# Patient Record
Sex: Female | Born: 1995 | Hispanic: Yes | Marital: Married | Smoking: Never smoker
Health system: Southern US, Community
[De-identification: ages and names within clinical notes are randomized; demographics above are authoritative.]

## PROBLEM LIST (undated history)

## (undated) ENCOUNTER — Inpatient Hospital Stay (HOSPITAL_COMMUNITY): Payer: Self-pay

## (undated) DIAGNOSIS — Z8759 Personal history of other complications of pregnancy, childbirth and the puerperium: Secondary | ICD-10-CM

## (undated) DIAGNOSIS — A6000 Herpesviral infection of urogenital system, unspecified: Secondary | ICD-10-CM

## (undated) DIAGNOSIS — D649 Anemia, unspecified: Secondary | ICD-10-CM

## (undated) DIAGNOSIS — A630 Anogenital (venereal) warts: Secondary | ICD-10-CM

## (undated) HISTORY — DX: Herpesviral infection of urogenital system, unspecified: A60.00

## (undated) HISTORY — PX: NO PAST SURGERIES: SHX2092

## (undated) HISTORY — DX: Personal history of other complications of pregnancy, childbirth and the puerperium: Z87.59

## (undated) HISTORY — DX: Anogenital (venereal) warts: A63.0

## (undated) HISTORY — DX: Anemia, unspecified: D64.9

---

## 2017-02-23 ENCOUNTER — Ambulatory Visit: Payer: Self-pay | Admitting: Obstetrics

## 2017-03-25 ENCOUNTER — Ambulatory Visit (INDEPENDENT_AMBULATORY_CARE_PROVIDER_SITE_OTHER): Payer: BLUE CROSS/BLUE SHIELD

## 2017-03-25 DIAGNOSIS — Z3201 Encounter for pregnancy test, result positive: Secondary | ICD-10-CM | POA: Diagnosis not present

## 2017-03-25 DIAGNOSIS — N912 Amenorrhea, unspecified: Secondary | ICD-10-CM

## 2017-03-25 LAB — POCT URINE PREGNANCY: Preg Test, Ur: POSITIVE — AB

## 2017-03-25 NOTE — Progress Notes (Signed)
Pt presents for Nurse Visit today to confirm pregnancy. LMP:02/09/17 then again 02/19/17 was only 2 days  EDD:11/16/16 Pt given prenatal samples.

## 2017-03-25 NOTE — Progress Notes (Signed)
I have reviewed this chart and agree with the RN assessment and management.    K. Meryl Davis, M.D. Attending Obstetrician & Gynecologist, [facility],  Medical Group  

## 2017-04-02 ENCOUNTER — Other Ambulatory Visit: Payer: Self-pay

## 2017-04-02 DIAGNOSIS — Z3201 Encounter for pregnancy test, result positive: Secondary | ICD-10-CM

## 2017-04-02 MED ORDER — VITAFOL GUMMIES 3.33-0.333-34.8 MG PO CHEW: 1.0000 | CHEWABLE_TABLET | Freq: Every day | ORAL | 3 refills | Status: DC

## 2017-04-07 ENCOUNTER — Inpatient Hospital Stay (HOSPITAL_COMMUNITY)
Admission: AD | Admit: 2017-04-07 | Discharge: 2017-04-07 | Disposition: A | Discharge status: Home / Self Care | Payer: BLUE CROSS/BLUE SHIELD | Source: Ambulatory Visit | Attending: Family Medicine | Admitting: Family Medicine

## 2017-04-07 ENCOUNTER — Encounter (HOSPITAL_COMMUNITY): Payer: Self-pay

## 2017-04-07 ENCOUNTER — Inpatient Hospital Stay (HOSPITAL_COMMUNITY): Payer: BLUE CROSS/BLUE SHIELD

## 2017-04-07 DIAGNOSIS — Z3A01 Less than 8 weeks gestation of pregnancy: Secondary | ICD-10-CM | POA: Insufficient documentation

## 2017-04-07 DIAGNOSIS — Z3490 Encounter for supervision of normal pregnancy, unspecified, unspecified trimester: Secondary | ICD-10-CM

## 2017-04-07 DIAGNOSIS — N939 Abnormal uterine and vaginal bleeding, unspecified: Secondary | ICD-10-CM | POA: Diagnosis present

## 2017-04-07 DIAGNOSIS — O210 Mild hyperemesis gravidarum: Secondary | ICD-10-CM | POA: Diagnosis not present

## 2017-04-07 DIAGNOSIS — O209 Hemorrhage in early pregnancy, unspecified: Secondary | ICD-10-CM | POA: Insufficient documentation

## 2017-04-07 DIAGNOSIS — O219 Vomiting of pregnancy, unspecified: Secondary | ICD-10-CM | POA: Diagnosis not present

## 2017-04-07 LAB — URINALYSIS, ROUTINE W REFLEX MICROSCOPIC
Bilirubin Urine: NEGATIVE
GLUCOSE, UA: NEGATIVE mg/dL
KETONES UR: 5 mg/dL — AB
Leukocytes, UA: NEGATIVE
NITRITE: NEGATIVE
PH: 7 (ref 5.0–8.0)
PROTEIN: NEGATIVE mg/dL
Specific Gravity, Urine: 1.017 (ref 1.005–1.030)

## 2017-04-07 LAB — ABO/RH: ABO/RH(D): O POS

## 2017-04-07 LAB — CBC
HEMATOCRIT: 35.7 % — AB (ref 36.0–46.0)
Hemoglobin: 11.6 g/dL — ABNORMAL LOW (ref 12.0–15.0)
MCH: 29.8 pg (ref 26.0–34.0)
MCHC: 32.5 g/dL (ref 30.0–36.0)
MCV: 91.8 fL (ref 78.0–100.0)
PLATELETS: 281 10*3/uL (ref 150–400)
RBC: 3.89 MIL/uL (ref 3.87–5.11)
RDW: 13.7 % (ref 11.5–15.5)
WBC: 7.8 10*3/uL (ref 4.0–10.5)

## 2017-04-07 LAB — WET PREP, GENITAL
Sperm: NONE SEEN
Trich, Wet Prep: NONE SEEN
YEAST WET PREP: NONE SEEN

## 2017-04-07 LAB — HCG, QUANTITATIVE, PREGNANCY: HCG, BETA CHAIN, QUANT, S: 46447 m[IU]/mL — AB (ref <5)

## 2017-04-07 MED ORDER — PROMETHAZINE HCL 25 MG PO TABS: 12.5000 mg | ORAL_TABLET | Freq: Four times a day (QID) | ORAL | 0 refills | Status: DC | PRN

## 2017-04-07 NOTE — MAU Note (Signed)
Pt had some light pinkish vaginal bleeding this morning. No clots seen. Also noticed some watery discharge. No pain.

## 2017-04-07 NOTE — MAU Provider Note (Signed)
History     CSN: [id number]  Arrival date and time: 04/07/17 [id number]   First Provider Initiated Contact with Patient 04/07/17 [phone]      Chief Complaint  Patient presents with  . Vaginal Bleeding   Vaginal Bleeding  The patient's primary symptoms include vaginal bleeding. The patient's pertinent negatives include no pelvic pain. This is a new problem. The current episode started today (around 0630 ). The problem occurs constantly. The problem has been unchanged. The patient is experiencing no pain. She is pregnant. Associated symptoms include nausea. Pertinent negatives include no chills, dysuria, fever, frequency, urgency or vomiting. The vaginal bleeding is lighter than menses (pink). She has not been passing clots. She has not been passing tissue. The symptoms are aggravated by intercourse. She has tried nothing for the symptoms. Sexual activity: patient has had intercourse in the last 24 hours.  Her menstrual history has been regular (LMP 02/09/17 ).    Past Medical History:  Diagnosis Date  . HSV infection     Past Surgical History:  Procedure Laterality Date  . NO PAST SURGERIES      History reviewed. No pertinent family history.  Social History   Tobacco Use  . Smoking status: Never Smoker  . Smokeless tobacco: Never Used  Substance Use Topics  . Alcohol use: No    Frequency: Never  . Drug use: No    Allergies: No Known Allergies  Medications Prior to Admission  Medication Sig Dispense Refill Last Dose  . Prenatal Vit-Fe Phos-FA-Omega (VITAFOL GUMMIES) 3.33-0.333-34.8 MG CHEW Chew 1 tablet by mouth daily. (Patient taking differently: Chew 3 tablets by mouth daily. ) 90 tablet 3 Past Week at Unknown time    Review of Systems  Constitutional: Negative for chills and fever.  Gastrointestinal: Positive for nausea. Negative for vomiting.  Genitourinary: Positive for vaginal bleeding. Negative for dysuria, frequency, pelvic pain and urgency.   Physical Exam    Blood pressure 114/61, pulse 70, temperature 97.9 F (36.6 C), resp. rate 16, height 5\' 2"  (1.575 m), weight 109 lb (49.4 kg), last menstrual period 02/09/2017.  Physical Exam  Nursing note and vitals reviewed. Constitutional: She is oriented to person, place, and time. She appears well-developed and well-nourished.  HENT:  Head: Normocephalic.  Cardiovascular: Normal rate.  Respiratory: Effort normal.  GI: Soft. There is no tenderness. There is no rebound.  Neurological: She is alert and oriented to person, place, and time.  Skin: Skin is warm and dry.  Psychiatric: She has a normal mood and affect.     Results for orders placed or performed during the hospital encounter of 04/07/17 (from the past 24 hour(s))  Urinalysis, Routine w reflex microscopic     Status: Abnormal   Collection Time: 04/07/17  9:00 AM  Result Value Ref Range   Color, Urine YELLOW YELLOW   APPearance CLOUDY (A) CLEAR   Specific Gravity, Urine 1.017 1.005 - 1.030   pH 7.0 5.0 - 8.0   Glucose, UA NEGATIVE NEGATIVE mg/dL   Hgb urine dipstick SMALL (A) NEGATIVE   Bilirubin Urine NEGATIVE NEGATIVE   Ketones, ur 5 (A) NEGATIVE mg/dL   Protein, ur NEGATIVE NEGATIVE mg/dL   Nitrite NEGATIVE NEGATIVE   Leukocytes, UA NEGATIVE NEGATIVE   RBC / HPF 0-5 0 - 5 RBC/hpf   WBC, UA 0-5 0 - 5 WBC/hpf   Bacteria, UA RARE (A) NONE SEEN   Squamous Epithelial / LPF 0-5 (A) NONE SEEN   Mucus PRESENT   hCG, quantitative,  pregnancy     Status: Abnormal   Collection Time: 04/07/17  9:53 AM  Result Value Ref Range   hCG, Beta Chain, Quant, S 46,447 (H) <5 mIU/mL  CBC     Status: Abnormal   Collection Time: 04/07/17  9:54 AM  Result Value Ref Range   WBC 7.8 4.0 - 10.5 K/uL   RBC 3.89 3.87 - 5.11 MIL/uL   Hemoglobin 11.6 (L) 12.0 - 15.0 g/dL   HCT 29.535.7 (L) 62.136.0 - 30.846.0 %   MCV 91.8 78.0 - 100.0 fL   MCH 29.8 26.0 - 34.0 pg   MCHC 32.5 30.0 - 36.0 g/dL   RDW 65.713.7 84.611.5 - 96.215.5 %   Platelets 281 150 - 400 K/uL  Wet prep,  genital     Status: Abnormal   Collection Time: 04/07/17 10:01 AM  Result Value Ref Range   Yeast Wet Prep HPF POC NONE SEEN NONE SEEN   Trich, Wet Prep NONE SEEN NONE SEEN   Clue Cells Wet Prep HPF POC PRESENT (A) NONE SEEN   WBC, Wet Prep HPF POC FEW (A) NONE SEEN   Sperm NONE SEEN    [place] Ob Comp Less 14 Wks  Result Date: 04/07/2017 CLINICAL DATA:  Bleeding. EXAM: OBSTETRIC <14 WK US AND TRANSVAGINAL OB US TECHNIQUE: Both transabdominal and transvaginal ultrasound examinations were performed for complete evaluation of the gestation as well as the maternal uterus, adnexal regions, and pelvic cul-de-sac. Transvaginal technique was performed to assess early pregnancy. COMPARISON:  None. FINDINGS: Intrauterine gestational sac: Single Yolk sac:  Visualized. Embryo:  Visualized. Cardiac Activity: Present Heart Rate: 112  bpm MSD:   mm    w     d CRL:  5.2  mm   6 w   1 d                  US EDC: 11/30/2017 Subchorionic hemorrhage:  None visualized. Maternal uterus/adnexae: Right ovary: Normal Left ovary: Normal Other :None Free fluid:  Trace IMPRESSION: 1. Single living intrauterine gestation. The estimated gestational age is 6 weeks and 1 day. No complications. Electronically Signed   By: Signa Kellaylor  Stroud M.D.   On: 04/07/2017 11:01   [place] Ob Transvaginal  Result Date: 04/07/2017 CLINICAL DATA:  Bleeding. EXAM: OBSTETRIC <14 WK US AND TRANSVAGINAL OB US TECHNIQUE: Both transabdominal and transvaginal ultrasound examinations were performed for complete evaluation of the gestation as well as the maternal uterus, adnexal regions, and pelvic cul-de-sac. Transvaginal technique was performed to assess early pregnancy. COMPARISON:  None. FINDINGS: Intrauterine gestational sac: Single Yolk sac:  Visualized. Embryo:  Visualized. Cardiac Activity: Present Heart Rate: 112  bpm MSD:   mm    w     d CRL:  5.2  mm   6 w   1 d                  US EDC: 11/30/2017 Subchorionic hemorrhage:  None visualized. Maternal  uterus/adnexae: Right ovary: Normal Left ovary: Normal Other :None Free fluid:  Trace IMPRESSION: 1. Single living intrauterine gestation. The estimated gestational age is 6 weeks and 1 day. No complications. Electronically Signed   By: Signa Kellaylor  Stroud M.D.   On: 04/07/2017 11:01    MAU Course  Procedures  MDM   Assessment and Plan   1. Intrauterine pregnancy   2. [redacted] weeks gestation of pregnancy   3. Nausea and vomiting in pregnancy    DC home Comfort measures reviewed  1st Trimester  precautions  Bleeding precautions RX: phenergan PRN #30  Return to MAU as needed FU with OB as planned  Follow-up Information    Department, [facility] Follow up.   Contact information: [address] [place] [address] [phone]            Thressa Sheller 04/07/2017, 11:09 AM

## 2017-04-07 NOTE — Discharge Instructions (Signed)
Safe Medications in Pregnancy  ° °Acne: °Benzoyl Peroxide °Salicylic Acid ° °Backache/Headache: °Tylenol: 2 regular strength every 4 hours OR °             2 Extra strength every 6 hours ° °Colds/Coughs/Allergies: °Benadryl (alcohol free) 25 mg every 6 hours as needed °Breath right strips °Claritin °Cepacol throat lozenges °Chloraseptic throat spray °Cold-Eeze- up to three times per day °Cough drops, alcohol free °Flonase (by prescription only) °Guaifenesin °Mucinex °Robitussin DM (plain only, alcohol free) °Saline nasal spray/drops °Sudafed (pseudoephedrine) & Actifed ** use only after [redacted] weeks gestation and if you do not have high blood pressure °Tylenol °Vicks Vaporub °Zinc lozenges °Zyrtec  ° °Constipation: °Colace °Ducolax suppositories °Fleet enema °Glycerin suppositories °Metamucil °Milk of magnesia °Miralax °Senokot °Smooth move tea ° °Diarrhea: °Kaopectate °Imodium A-D ° °*NO pepto Bismol ° °Hemorrhoids: °Anusol °Anusol HC °Preparation H °Tucks ° °Indigestion: °Tums °Maalox °Mylanta °Zantac  °Pepcid ° °Insomnia: °Benadryl (alcohol free) 25mg every 6 hours as needed °Tylenol PM °Unisom, no Gelcaps ° °Leg Cramps: °Tums °MagGel ° °Nausea/Vomiting:  °Bonine °Dramamine °Emetrol °Ginger extract °Sea bands °Meclizine  °Nausea medication to take during pregnancy:  °Unisom (doxylamine succinate 25 mg tablets) Take one tablet daily at bedtime. If symptoms are not adequately controlled, the dose can be increased to a maximum recommended dose of two tablets daily (1/2 tablet in the morning, 1/2 tablet mid-afternoon and one at bedtime). °Vitamin B6 100mg tablets. Take one tablet twice a day (up to 200 mg per day). ° °Skin Rashes: °Aveeno products °Benadryl cream or 25mg every 6 hours as needed °Calamine Lotion °1% cortisone cream ° °Yeast infection: °Gyne-lotrimin 7 °Monistat 7 ° ° °**If taking multiple medications, please check labels to avoid duplicating the same active ingredients °**take medication as directed on  the label °** Do not exceed 4000 mg of tylenol in 24 hours °**Do not take medications that contain aspirin or ibuprofen ° °Prenatal Care Providers °[facility]    [facility] ° Phone- [phone]     Phone: [phone] °         °[facility]                      [facility] ° @[place]     Phone: [phone] ° Phone: [phone] °        [facility] °[facility]     Phone: [phone] ° Phone: [phone]   °        [facility] °[facility] [place]                hone: [phone] ° Phone: [phone] °        [facility] °Dr. Bernard Marshall      Phone: [phone] ° Phone: [phone] °        [facility] °[facility]                Phone: [phone] ° Women’s [facility]  ° Phone:[phone]    [facility] °         Phone: [phone] °[facility] °  Phone: [phone] ° °

## 2017-04-08 LAB — GC/CHLAMYDIA PROBE AMP (~~LOC~~) NOT AT ARMC
Chlamydia: NEGATIVE
NEISSERIA GONORRHEA: NEGATIVE

## 2017-04-28 NOTE — L&D Delivery Note (Signed)
Faculty Note  In to room, patient with chorioamnionitis, recurrent variable decels, amnioinfusion in place with minimal improvement. Epidural in place. Patient progressed and anterior lip able to be reduced, she was 10/100/+2 with pushing, she was pushing effective. Strip with variable decels and decreased variability, counseled her regarding recommendation for vacuum assisted delivery.  Reviewed risks of vacuum assisted delivery including risk of cephalohematoma, subgaleal hemorrhage, failed vacuum or dystocia requiring emergency c-section, risks of prolonged hypoxia in fetus, risks of damage to maternal tissue. Reviewed recommendation to proceed with assisted delivery and reviewed risks of c-section including infection, hemorrhage, damage to surrounding tissue and organs. She verbalizes understanding of the above and is agreeable to a vacuum assisted delivery.  Kiwi suction cup applied to fetal head, 2 cm anterior to the posterior fontanelle and equidistant across the sagittal sutures. The cup was inspected to ensure to vaginal tissue free from cup. Suction increased to 450 mm Hg and gentle traction placed on vacuum device during contraction to assist maternal expulsive forces. Total applied pressure time 40 seconds with 4 pulls. Zero popoffs occurred. Suction released with delivery of fetal head. Loose nuchal cord reduced, thick meconium noted. Infant delivered easily from ROA position. Infant with poor tone, cord immediately clamped and cut and infant handed to waiting pedi staff who inspected infant and found no evidence of cephalohematoma or similar injury. Cord blood and gases obtained.   Placenta delivered with gentle traction, and tore slightly. On inspection, all cotelydons intact however on sweep of uterus, small amount of membranes recovered manually. Uterus with poor tone, methergine given and uterus firmed with removal of membranes and administration of methergine. Patient had bilateral vaginal  wall and labial lacerations which were repaired with figure of 8 sutures and interrupted sutures of 3-0 monocryl.  Inspection of placenta after showed dark area with evidence of infarct. EBL: 900 mL. Apgars 3/9/9. Patient tolerated very well. Infant remains in room with patient and husband.   Baldemar LenisK. Meryl Jillyn Stacey, M.D. [facility]

## 2017-05-04 ENCOUNTER — Ambulatory Visit (INDEPENDENT_AMBULATORY_CARE_PROVIDER_SITE_OTHER): Payer: BLUE CROSS/BLUE SHIELD | Admitting: Obstetrics and Gynecology

## 2017-05-04 ENCOUNTER — Encounter: Payer: Self-pay | Admitting: *Deleted

## 2017-05-04 ENCOUNTER — Encounter: Payer: Self-pay | Admitting: Obstetrics and Gynecology

## 2017-05-04 DIAGNOSIS — Z3400 Encounter for supervision of normal first pregnancy, unspecified trimester: Secondary | ICD-10-CM | POA: Insufficient documentation

## 2017-05-04 DIAGNOSIS — Z8619 Personal history of other infectious and parasitic diseases: Secondary | ICD-10-CM

## 2017-05-04 DIAGNOSIS — Z113 Encounter for screening for infections with a predominantly sexual mode of transmission: Secondary | ICD-10-CM

## 2017-05-04 DIAGNOSIS — Z3401 Encounter for supervision of normal first pregnancy, first trimester: Secondary | ICD-10-CM

## 2017-05-04 NOTE — Progress Notes (Signed)
  Subjective:    Rachel Olsen is a G3P0020 5331w0d being seen today for her first obstetrical visit.  Her obstetrical history is significant for first pregnancy and history of genital herpes. Patient does intend to breast feed. Pregnancy history fully reviewed.  Patient reports nausea relieved with phenergan.  Vitals:   05/04/17 1019  BP: 104/71  Pulse: 88  Weight: 104 lb 11.2 oz (47.5 kg)    HISTORY: OB History  Gravida Para Term Preterm AB Living  3       2    SAB TAB Ectopic Multiple Live Births  1 1          # Outcome Date GA Lbr Len/2nd Weight Sex Delivery Anes PTL Lv  3 Current           2 TAB           1 SAB              Past Medical History:  Diagnosis Date  . Genital herpes   . HSV infection    Past Surgical History:  Procedure Laterality Date  . NO PAST SURGERIES     Family History  Problem Relation Age of Onset  . Diabetes Sister   . Diabetes Maternal Aunt   . Hypertension Maternal Aunt   . Diabetes Maternal Uncle   . Hypertension Maternal Uncle   . Diabetes Maternal Grandmother   . Hypertension Maternal Grandmother   . Diabetes Maternal Grandfather   . Hypertension Maternal Grandfather      Exam  Blood pressure 104/71, pulse 88, weight 104 lb 11.2 oz (47.5 kg), last menstrual period 02/09/2017.   Uterus:     Pelvic Exam:    Perineum: Normal Perineum   Vulva: normal   Vagina:  normal mucosa, normal discharge   pH:    Cervix: multiparous appearance and cervix is closed and long   Adnexa: normal adnexa and no mass, fullness, tenderness   Bony Pelvis: gynecoid  System: Breast:  normal appearance, no masses or tenderness   Skin: normal coloration and turgor, no rashes    Neurologic: oriented, no focal deficits   Extremities: normal strength, tone, and muscle mass   HEENT extra ocular movement intact   Mouth/Teeth mucous membranes moist, pharynx normal without lesions and dental hygiene good   Neck supple and no masses   Cardiovascular:  regular rate and rhythm   Respiratory:  chest clear, no wheezing, crepitations, rhonchi, normal symmetric air entry   Abdomen: soft, non-tender; bowel sounds normal; no masses,  no organomegaly   Urinary:       Assessment:    Pregnancy: [id number] Patient Active Problem List   Diagnosis Date Noted  . Supervision of normal first pregnancy, antepartum 05/04/2017        Plan:     Initial labs drawn. Prenatal vitamins. Problem list reviewed and updated. Genetic Screening discussed First Screen: declined. Will offer quad screen in second trimester  Ultrasound discussed; fetal survey: requested.  Follow up in 4 weeks. 50% of 30 min visit spent on counseling and coordination of care.     Xareni Kelch 05/04/2017

## 2017-05-04 NOTE — Patient Instructions (Addendum)
 First Trimester of Pregnancy The first trimester of pregnancy is from week 1 until the end of week 13 (months 1 through 3). A week after a sperm fertilizes an egg, the egg will implant on the wall of the uterus. This embryo will begin to develop into a baby. Genes from you and your partner will form the baby. The female genes will determine whether the baby will be a boy or a girl. At 6-8 weeks, the eyes and face will be formed, and the heartbeat can be seen on ultrasound. At the end of 12 weeks, all the baby's organs will be formed. Now that you are pregnant, you will want to do everything you can to have a healthy baby. Two of the most important things are to get good prenatal care and to follow your health care provider's instructions. Prenatal care is all the medical care you receive before the baby's birth. This care will help prevent, find, and treat any problems during the pregnancy and childbirth. Body changes during your first trimester Your body goes through many changes during pregnancy. The changes vary from woman to woman.  You may gain or lose a couple of pounds at first.  You may feel sick to your stomach (nauseous) and you may throw up (vomit). If the vomiting is uncontrollable, call your health care provider.  You may tire easily.  You may develop headaches that can be relieved by medicines. All medicines should be approved by your health care provider.  You may urinate more often. Painful urination may mean you have a bladder infection.  You may develop heartburn as a result of your pregnancy.  You may develop constipation because certain hormones are causing the muscles that push stool through your intestines to slow down.  You may develop hemorrhoids or swollen veins (varicose veins).  Your breasts may begin to grow larger and become tender. Your nipples may stick out more, and the tissue that surrounds them (areola) may become darker.  Your gums may bleed and may be  sensitive to brushing and flossing.  Dark spots or blotches (chloasma, mask of pregnancy) may develop on your face. This will likely fade after the baby is born.  Your menstrual periods will stop.  You may have a loss of appetite.  You may develop cravings for certain kinds of food.  You may have changes in your emotions from day to day, such as being excited to be pregnant or being concerned that something may go wrong with the pregnancy and baby.  You may have more vivid and strange dreams.  You may have changes in your hair. These can include thickening of your hair, rapid growth, and changes in texture. Some women also have hair loss during or after pregnancy, or hair that feels dry or thin. Your hair will most likely return to normal after your baby is born.  What to expect at prenatal visits During a routine prenatal visit:  You will be weighed to make sure you and the baby are growing normally.  Your blood pressure will be taken.  Your abdomen will be measured to track your baby's growth.  The fetal heartbeat will be listened to between weeks 10 and 14 of your pregnancy.  Test results from any previous visits will be discussed.  Your health care provider may ask you:  How you are feeling.  If you are feeling the baby move.  If you have had any abnormal symptoms, such as leaking fluid, bleeding, severe   headaches, or abdominal cramping.  If you are using any tobacco products, including cigarettes, chewing tobacco, and electronic cigarettes.  If you have any questions.  Other tests that may be performed during your first trimester include:  Blood tests to find your blood type and to check for the presence of any previous infections. The tests will also be used to check for low iron levels (anemia) and protein on red blood cells (Rh antibodies). Depending on your risk factors, or if you previously had diabetes during pregnancy, you may have tests to check for high blood  sugar that affects pregnant women (gestational diabetes).  Urine tests to check for infections, diabetes, or protein in the urine.  An ultrasound to confirm the proper growth and development of the baby.  Fetal screens for spinal cord problems (spina bifida) and Down syndrome.  HIV (human immunodeficiency virus) testing. Routine prenatal testing includes screening for HIV, unless you choose not to have this test.  You may need other tests to make sure you and the baby are doing well.  Follow these instructions at home: Medicines  Follow your health care provider's instructions regarding medicine use. Specific medicines may be either safe or unsafe to take during pregnancy.  Take a prenatal vitamin that contains at least 600 micrograms (mcg) of folic acid.  If you develop constipation, try taking a stool softener if your health care provider approves. Eating and drinking  Eat a balanced diet that includes fresh fruits and vegetables, whole grains, good sources of protein such as meat, eggs, or tofu, and low-fat dairy. Your health care provider will help you determine the amount of weight gain that is right for you.  Avoid raw meat and uncooked cheese. These carry germs that can cause birth defects in the baby.  Eating four or five small meals rather than three large meals a day may help relieve nausea and vomiting. If you start to feel nauseous, eating a few soda crackers can be helpful. Drinking liquids between meals, instead of during meals, also seems to help ease nausea and vomiting.  Limit foods that are high in fat and processed sugars, such as fried and sweet foods.  To prevent constipation: ? Eat foods that are high in fiber, such as fresh fruits and vegetables, whole grains, and beans. ? Drink enough fluid to keep your urine clear or pale yellow. Activity  Exercise only as directed by your health care provider. Most women can continue their usual exercise routine during  pregnancy. Try to exercise for 30 minutes at least 5 days a week. Exercising will help you: ? Control your weight. ? Stay in shape. ? Be prepared for labor and delivery.  Experiencing pain or cramping in the lower abdomen or lower back is a good sign that you should stop exercising. Check with your health care provider before continuing with normal exercises.  Try to avoid standing for long periods of time. Move your legs often if you must stand in one place for a long time.  Avoid heavy lifting.  Wear low-heeled shoes and practice good posture.  You may continue to have sex unless your health care provider tells you not to. Relieving pain and discomfort  Wear a good support bra to relieve breast tenderness.  Take warm sitz baths to soothe any pain or discomfort caused by hemorrhoids. Use hemorrhoid cream if your health care provider approves.  Rest with your legs elevated if you have leg cramps or low back pain.  If you   develop varicose veins in your legs, wear support hose. Elevate your feet for 15 minutes, 3-4 times a day. Limit salt in your diet. Prenatal care  Schedule your prenatal visits by the twelfth week of pregnancy. They are usually scheduled monthly at first, then more often in the last 2 months before delivery.  Write down your questions. Take them to your prenatal visits.  Keep all your prenatal visits as told by your health care provider. This is important. Safety  Wear your seat belt at all times when driving.  Make a list of emergency phone numbers, including numbers for family, friends, the hospital, and police and fire departments. General instructions  Ask your health care provider for a referral to a local prenatal education class. Begin classes no later than the beginning of month 6 of your pregnancy.  Ask for help if you have counseling or nutritional needs during pregnancy. Your health care provider can offer advice or refer you to specialists for help  with various needs.  Do not use hot tubs, steam rooms, or saunas.  Do not douche or use tampons or scented sanitary pads.  Do not cross your legs for long periods of time.  Avoid cat litter boxes and soil used by cats. These carry germs that can cause birth defects in the baby and possibly loss of the fetus by miscarriage or stillbirth.  Avoid all smoking, herbs, alcohol, and medicines not prescribed by your health care provider. Chemicals in these products affect the formation and growth of the baby.  Do not use any products that contain nicotine or tobacco, such as cigarettes and e-cigarettes. If you need help quitting, ask your health care provider. You may receive counseling support and other resources to help you quit.  Schedule a dentist appointment. At home, brush your teeth with a soft toothbrush and be gentle when you floss. Contact a health care provider if:  You have dizziness.  You have mild pelvic cramps, pelvic pressure, or nagging pain in the abdominal area.  You have persistent nausea, vomiting, or diarrhea.  You have a bad smelling vaginal discharge.  You have pain when you urinate.  You notice increased swelling in your face, hands, legs, or ankles.  You are exposed to fifth disease or chickenpox.  You are exposed to German measles (rubella) and have never had it. Get help right away if:  You have a fever.  You are leaking fluid from your vagina.  You have spotting or bleeding from your vagina.  You have severe abdominal cramping or pain.  You have rapid weight gain or loss.  You vomit blood or material that looks like coffee grounds.  You develop a severe headache.  You have shortness of breath.  You have any kind of trauma, such as from a fall or a car accident. Summary  The first trimester of pregnancy is from week 1 until the end of week 13 (months 1 through 3).  Your body goes through many changes during pregnancy. The changes vary from  woman to woman.  You will have routine prenatal visits. During those visits, your health care provider will examine you, discuss any test results you may have, and talk with you about how you are feeling. This information is not intended to replace advice given to you by your health care provider. Make sure you discuss any questions you have with your health care provider. Document Released: 04/08/2001 Document Revised: 03/26/2016 Document Reviewed: 03/26/2016 [employer] Interactive Patient Education  2018   [employer]   Second Trimester of Pregnancy The second trimester is from week 14 through week 27 (months 4 through 6). The second trimester is often a time when you feel your best. Your body has adjusted to being pregnant, and you begin to feel better physically. Usually, morning sickness has lessened or quit completely, you may have more energy, and you may have an increase in appetite. The second trimester is also a time when the fetus is growing rapidly. At the end of the sixth month, the fetus is about 9 inches long and weighs about 1 pounds. You will likely begin to feel the baby move (quickening) between 16 and 20 weeks of pregnancy. Body changes during your second trimester Your body continues to go through many changes during your second trimester. The changes vary from woman to woman.  Your weight will continue to increase. You will notice your lower abdomen bulging out.  You may begin to get stretch marks on your hips, abdomen, and breasts.  You may develop headaches that can be relieved by medicines. The medicines should be approved by your health care provider.  You may urinate more often because the fetus is pressing on your bladder.  You may develop or continue to have heartburn as a result of your pregnancy.  You may develop constipation because certain hormones are causing the muscles that push waste through your intestines to slow down.  You may develop hemorrhoids or  swollen, bulging veins (varicose veins).  You may have back pain. This is caused by: ? Weight gain. ? Pregnancy hormones that are relaxing the joints in your pelvis. ? A shift in weight and the muscles that support your balance.  Your breasts will continue to grow and they will continue to become tender.  Your gums may bleed and may be sensitive to brushing and flossing.  Dark spots or blotches (chloasma, mask of pregnancy) may develop on your face. This will likely fade after the baby is born.  A dark line from your belly button to the pubic area (linea nigra) may appear. This will likely fade after the baby is born.  You may have changes in your hair. These can include thickening of your hair, rapid growth, and changes in texture. Some women also have hair loss during or after pregnancy, or hair that feels dry or thin. Your hair will most likely return to normal after your baby is born.  What to expect at prenatal visits During a routine prenatal visit:  You will be weighed to make sure you and the fetus are growing normally.  Your blood pressure will be taken.  Your abdomen will be measured to track your baby's growth.  The fetal heartbeat will be listened to.  Any test results from the previous visit will be discussed.  Your health care provider may ask you:  How you are feeling.  If you are feeling the baby move.  If you have had any abnormal symptoms, such as leaking fluid, bleeding, severe headaches, or abdominal cramping.  If you are using any tobacco products, including cigarettes, chewing tobacco, and electronic cigarettes.  If you have any questions.  Other tests that may be performed during your second trimester include:  Blood tests that check for: ? Low iron levels (anemia). ? High blood sugar that affects pregnant women (gestational diabetes) between 24 and 28 weeks. ? Rh antibodies. This is to check for a protein on red blood cells (Rh factor).  Urine  tests to   check for infections, diabetes, or protein in the urine.  An ultrasound to confirm the proper growth and development of the baby.  An amniocentesis to check for possible genetic problems.  Fetal screens for spina bifida and Down syndrome.  HIV (human immunodeficiency virus) testing. Routine prenatal testing includes screening for HIV, unless you choose not to have this test.  Follow these instructions at home: Medicines  Follow your health care provider's instructions regarding medicine use. Specific medicines may be either safe or unsafe to take during pregnancy.  Take a prenatal vitamin that contains at least 600 micrograms (mcg) of folic acid.  If you develop constipation, try taking a stool softener if your health care provider approves. Eating and drinking  Eat a balanced diet that includes fresh fruits and vegetables, whole grains, good sources of protein such as meat, eggs, or tofu, and low-fat dairy. Your health care provider will help you determine the amount of weight gain that is right for you.  Avoid raw meat and uncooked cheese. These carry germs that can cause birth defects in the baby.  If you have low calcium intake from food, talk to your health care provider about whether you should take a daily calcium supplement.  Limit foods that are high in fat and processed sugars, such as fried and sweet foods.  To prevent constipation: ? Drink enough fluid to keep your urine clear or pale yellow. ? Eat foods that are high in fiber, such as fresh fruits and vegetables, whole grains, and beans. Activity  Exercise only as directed by your health care provider. Most women can continue their usual exercise routine during pregnancy. Try to exercise for 30 minutes at least 5 days a week. Stop exercising if you experience uterine contractions.  Avoid heavy lifting, wear low heel shoes, and practice good posture.  A sexual relationship may be continued unless your health  care provider directs you otherwise. Relieving pain and discomfort  Wear a good support bra to prevent discomfort from breast tenderness.  Take warm sitz baths to soothe any pain or discomfort caused by hemorrhoids. Use hemorrhoid cream if your health care provider approves.  Rest with your legs elevated if you have leg cramps or low back pain.  If you develop varicose veins, wear support hose. Elevate your feet for 15 minutes, 3-4 times a day. Limit salt in your diet. Prenatal Care  Write down your questions. Take them to your prenatal visits.  Keep all your prenatal visits as told by your health care provider. This is important. Safety  Wear your seat belt at all times when driving.  Make a list of emergency phone numbers, including numbers for family, friends, the hospital, and police and fire departments. General instructions  Ask your health care provider for a referral to a local prenatal education class. Begin classes no later than the beginning of month 6 of your pregnancy.  Ask for help if you have counseling or nutritional needs during pregnancy. Your health care provider can offer advice or refer you to specialists for help with various needs.  Do not use hot tubs, steam rooms, or saunas.  Do not douche or use tampons or scented sanitary pads.  Do not cross your legs for long periods of time.  Avoid cat litter boxes and soil used by cats. These carry germs that can cause birth defects in the baby and possibly loss of the fetus by miscarriage or stillbirth.  Avoid all smoking, herbs, alcohol, and unprescribed drugs. Chemicals   in these products can affect the formation and growth of the baby.  Do not use any products that contain nicotine or tobacco, such as cigarettes and e-cigarettes. If you need help quitting, ask your health care provider.  Visit your dentist if you have not gone yet during your pregnancy. Use a soft toothbrush to brush your teeth and be gentle when  you floss. Contact a health care provider if:  You have dizziness.  You have mild pelvic cramps, pelvic pressure, or nagging pain in the abdominal area.  You have persistent nausea, vomiting, or diarrhea.  You have a bad smelling vaginal discharge.  You have pain when you urinate. Get help right away if:  You have a fever.  You are leaking fluid from your vagina.  You have spotting or bleeding from your vagina.  You have severe abdominal cramping or pain.  You have rapid weight gain or weight loss.  You have shortness of breath with chest pain.  You notice sudden or extreme swelling of your face, hands, ankles, feet, or legs.  You have not felt your baby move in over an hour.  You have severe headaches that do not go away when you take medicine.  You have vision changes. Summary  The second trimester is from week 14 through week 27 (months 4 through 6). It is also a time when the fetus is growing rapidly.  Your body goes through many changes during pregnancy. The changes vary from woman to woman.  Avoid all smoking, herbs, alcohol, and unprescribed drugs. These chemicals affect the formation and growth your baby.  Do not use any tobacco products, such as cigarettes, chewing tobacco, and e-cigarettes. If you need help quitting, ask your health care provider.  Contact your health care provider if you have any questions. Keep all prenatal visits as told by your health care provider. This is important. This information is not intended to replace advice given to you by your health care provider. Make sure you discuss any questions you have with your health care provider. Document Released: 04/08/2001 Document Revised: 05/20/2016 Document Reviewed: 05/20/2016 [employer] Interactive Patient Education  2018 [employer]   Contraception Choices Contraception, also called birth control, refers to methods or devices that prevent pregnancy. Hormonal methods Contraceptive  implant A contraceptive implant is a thin, plastic tube that contains a hormone. It is inserted into the upper part of the arm. It can remain in place for up to 3 years. Progestin-only injections Progestin-only injections are injections of progestin, a synthetic form of the hormone progesterone. They are given every 3 months by a health care provider. Birth control pills Birth control pills are pills that contain hormones that prevent pregnancy. They must be taken once a day, preferably at the same time each day. Birth control patch The birth control patch contains hormones that prevent pregnancy. It is placed on the skin and must be changed once a week for three weeks and removed on the fourth week. A prescription is needed to use this method of contraception. Vaginal ring A vaginal ring contains hormones that prevent pregnancy. It is placed in the vagina for three weeks and removed on the fourth week. After that, the process is repeated with a new ring. A prescription is needed to use this method of contraception. Emergency contraceptive Emergency contraceptives prevent pregnancy after unprotected sex. They come in pill form and can be taken up to 5 days after sex. They work best the sooner they are taken after having   sex. Most emergency contraceptives are available without a prescription. This method should not be used as your only form of birth control. Barrier methods Female condom A female condom is a thin sheath that is worn over the penis during sex. Condoms keep sperm from going inside a woman's body. They can be used with a spermicide to increase their effectiveness. They should be disposed after a single use. Female condom A female condom is a soft, loose-fitting sheath that is put into the vagina before sex. The condom keeps sperm from going inside a woman's body. They should be disposed after a single use. Diaphragm A diaphragm is a soft, dome-shaped barrier. It is inserted into the vagina  before sex, along with a spermicide. The diaphragm blocks sperm from entering the uterus, and the spermicide kills sperm. A diaphragm should be left in the vagina for 6-8 hours after sex and removed within 24 hours. A diaphragm is prescribed and fitted by a health care provider. A diaphragm should be replaced every 1-2 years, after giving birth, after gaining more than 15 lb (6.8 kg), and after pelvic surgery. Cervical cap A cervical cap is a round, soft latex or plastic cup that fits over the cervix. It is inserted into the vagina before sex, along with spermicide. It blocks sperm from entering the uterus. The cap should be left in place for 6-8 hours after sex and removed within 48 hours. A cervical cap must be prescribed and fitted by a health care provider. It should be replaced every 2 years. Sponge A sponge is a soft, circular piece of polyurethane foam with spermicide on it. The sponge helps block sperm from entering the uterus, and the spermicide kills sperm. To use it, you make it wet and then insert it into the vagina. It should be inserted before sex, left in for at least 6 hours after sex, and removed and thrown away within 30 hours. Spermicides Spermicides are chemicals that kill or block sperm from entering the cervix and uterus. They can come as a cream, jelly, suppository, foam, or tablet. A spermicide should be inserted into the vagina with an applicator at least 10-15 minutes before sex to allow time for it to work. The process must be repeated every time you have sex. Spermicides do not require a prescription. Intrauterine contraception Intrauterine device (IUD) An IUD is a T-shaped device that is put in a woman's uterus. There are two types:  Hormone IUD.This type contains progestin, a synthetic form of the hormone progesterone. This type can stay in place for 3-5 years.  Copper IUD.This type is wrapped in copper wire. It can stay in place for 10 years.  Permanent methods of  contraception Female tubal ligation In this method, a woman's fallopian tubes are sealed, tied, or blocked during surgery to prevent eggs from traveling to the uterus. Hysteroscopic sterilization In this method, a small, flexible insert is placed into each fallopian tube. The inserts cause scar tissue to form in the fallopian tubes and block them, so sperm cannot reach an egg. The procedure takes about 3 months to be effective. Another form of birth control must be used during those 3 months. Female sterilization This is a procedure to tie off the tubes that carry sperm (vasectomy). After the procedure, the man can still ejaculate fluid (semen). Natural planning methods Natural family planning In this method, a couple does not have sex on days when the woman could become pregnant. Calendar method This means keeping track of the   length of each menstrual cycle, identifying the days when pregnancy can happen, and not having sex on those days. Ovulation method In this method, a couple avoids sex during ovulation. Symptothermal method This method involves not having sex during ovulation. The woman typically checks for ovulation by watching changes in her temperature and in the consistency of cervical mucus. Post-ovulation method In this method, a couple waits to have sex until after ovulation. Summary  Contraception, also called birth control, means methods or devices that prevent pregnancy.  Hormonal methods of contraception include implants, injections, pills, patches, vaginal rings, and emergency contraceptives.  Barrier methods of contraception can include female condoms, female condoms, diaphragms, cervical caps, sponges, and spermicides.  There are two types of IUDs (intrauterine devices). An IUD can be put in a woman's uterus to prevent pregnancy for 3-5 years.  Permanent sterilization can be done through a procedure for males, females, or both.  Natural family planning methods involve  not having sex on days when the woman could become pregnant. This information is not intended to replace advice given to you by your health care provider. Make sure you discuss any questions you have with your health care provider. Document Released: 04/14/2005 Document Revised: 05/17/2016 Document Reviewed: 05/17/2016 Elsevier Interactive Patient Education  2018 [employer]   Breastfeeding Choosing to breastfeed is one of the best decisions you can make for yourself and your baby. A change in hormones during pregnancy causes your breasts to make breast milk in your milk-producing glands. Hormones prevent breast milk from being released before your baby is born. They also prompt milk flow after birth. Once breastfeeding has begun, thoughts of your baby, as well as his or her sucking or crying, can stimulate the release of milk from your milk-producing glands. Benefits of breastfeeding Research shows that breastfeeding offers many health benefits for infants and mothers. It also offers a cost-free and convenient way to feed your baby. For your baby  Your first milk (colostrum) helps your baby's digestive system to function better.  Special cells in your milk (antibodies) help your baby to fight off infections.  Breastfed babies are less likely to develop asthma, allergies, obesity, or type 2 diabetes. They are also at lower risk for sudden infant death syndrome (SIDS).  Nutrients in breast milk are better able to meet your baby's needs compared to infant formula.  Breast milk improves your baby's brain development. For you  Breastfeeding helps to create a very special bond between you and your baby.  Breastfeeding is convenient. Breast milk costs nothing and is always available at the correct temperature.  Breastfeeding helps to burn calories. It helps you to lose the weight that you gained during pregnancy.  Breastfeeding makes your uterus return faster to its size before pregnancy.  It also slows bleeding (lochia) after you give birth.  Breastfeeding helps to lower your risk of developing type 2 diabetes, osteoporosis, rheumatoid arthritis, cardiovascular disease, and breast, ovarian, uterine, and endometrial cancer later in life. Breastfeeding basics Starting breastfeeding  Find a comfortable place to sit or lie down, with your neck and back well-supported.  Place a pillow or a rolled-up blanket under your baby to bring him or her to the level of your breast (if you are seated). Nursing pillows are specially designed to help support your arms and your baby while you breastfeed.  Make sure that your baby's tummy (abdomen) is facing your abdomen.  Gently massage your breast. With your fingertips, massage from the outer edges of   your breast inward toward the nipple. This encourages milk flow. If your milk flows slowly, you may need to continue this action during the feeding.  Support your breast with 4 fingers underneath and your thumb above your nipple (make the letter "C" with your hand). Make sure your fingers are well away from your nipple and your baby's mouth.  Stroke your baby's lips gently with your finger or nipple.  When your baby's mouth is open wide enough, quickly bring your baby to your breast, placing your entire nipple and as much of the areola as possible into your baby's mouth. The areola is the colored area around your nipple. ? More areola should be visible above your baby's upper lip than below the lower lip. ? Your baby's lips should be opened and extended outward (flanged) to ensure an adequate, comfortable latch. ? Your baby's tongue should be between his or her lower gum and your breast.  Make sure that your baby's mouth is correctly positioned around your nipple (latched). Your baby's lips should create a seal on your breast and be turned out (everted).  It is common for your baby to suck about 2-3 minutes in order to start the flow of breast  milk. Latching Teaching your baby how to latch onto your breast properly is very important. An improper latch can cause nipple pain, decreased milk supply, and poor weight gain in your baby. Also, if your baby is not latched onto your nipple properly, he or she may swallow some air during feeding. This can make your baby fussy. Burping your baby when you switch breasts during the feeding can help to get rid of the air. However, teaching your baby to latch on properly is still the best way to prevent fussiness from swallowing air while breastfeeding. Signs that your baby has successfully latched onto your nipple  Silent tugging or silent sucking, without causing you pain. Infant's lips should be extended outward (flanged).  Swallowing heard between every 3-4 sucks once your milk has started to flow (after your let-down milk reflex occurs).  Muscle movement above and in front of his or her ears while sucking.  Signs that your baby has not successfully latched onto your nipple  Sucking sounds or smacking sounds from your baby while breastfeeding.  Nipple pain.  If you think your baby has not latched on correctly, slip your finger into the corner of your baby's mouth to break the suction and place it between your baby's gums. Attempt to start breastfeeding again. Signs of successful breastfeeding Signs from your baby  Your baby will gradually decrease the number of sucks or will completely stop sucking.  Your baby will fall asleep.  Your baby's body will relax.  Your baby will retain a small amount of milk in his or her mouth.  Your baby will let go of your breast by himself or herself.  Signs from you  Breasts that have increased in firmness, weight, and size 1-3 hours after feeding.  Breasts that are softer immediately after breastfeeding.  Increased milk volume, as well as a change in milk consistency and color by the fifth day of breastfeeding.  Nipples that are not sore,  cracked, or bleeding.  Signs that your baby is getting enough milk  Wetting at least 1-2 diapers during the first 24 hours after birth.  Wetting at least 5-6 diapers every 24 hours for the first week after birth. The urine should be clear or pale yellow by the age of 5   days.  Wetting 6-8 diapers every 24 hours as your baby continues to grow and develop.  At least 3 stools in a 24-hour period by the age of 5 days. The stool should be soft and yellow.  At least 3 stools in a 24-hour period by the age of 7 days. The stool should be seedy and yellow.  No loss of weight greater than 10% of birth weight during the first 3 days of life.  Average weight gain of 4-7 oz (113-198 g) per week after the age of 4 days.  Consistent daily weight gain by the age of 5 days, without weight loss after the age of 2 weeks. After a feeding, your baby may spit up a small amount of milk. This is normal. Breastfeeding frequency and duration Frequent feeding will help you make more milk and can prevent sore nipples and extremely full breasts (breast engorgement). Breastfeed when you feel the need to reduce the fullness of your breasts or when your baby shows signs of hunger. This is called "breastfeeding on demand." Signs that your baby is hungry include:  Increased alertness, activity, or restlessness.  Movement of the head from side to side.  Opening of the mouth when the corner of the mouth or cheek is stroked (rooting).  Increased sucking sounds, smacking lips, cooing, sighing, or squeaking.  Hand-to-mouth movements and sucking on fingers or hands.  Fussing or crying.  Avoid introducing a pacifier to your baby in the first 4-6 weeks after your baby is born. After this time, you may choose to use a pacifier. Research has shown that pacifier use during the first year of a baby's life decreases the risk of sudden infant death syndrome (SIDS). Allow your baby to feed on each breast as long as he or she  wants. When your baby unlatches or falls asleep while feeding from the first breast, offer the second breast. Because newborns are often sleepy in the first few weeks of life, you may need to awaken your baby to get him or her to feed. Breastfeeding times will vary from baby to baby. However, the following rules can serve as a guide to help you make sure that your baby is properly fed:  Newborns (babies 4 weeks of age or younger) may breastfeed every 1-3 hours.  Newborns should not go without breastfeeding for longer than 3 hours during the day or 5 hours during the night.  You should breastfeed your baby a minimum of 8 times in a 24-hour period.  Breast milk pumping Pumping and storing breast milk allows you to make sure that your baby is exclusively fed your breast milk, even at times when you are unable to breastfeed. This is especially important if you go back to work while you are still breastfeeding, or if you are not able to be present during feedings. Your lactation consultant can help you find a method of pumping that works best for you and give you guidelines about how long it is safe to store breast milk. Caring for your breasts while you breastfeed Nipples can become dry, cracked, and sore while breastfeeding. The following recommendations can help keep your breasts moisturized and healthy:  Avoid using soap on your nipples.  Wear a supportive bra designed especially for nursing. Avoid wearing underwire-style bras or extremely tight bras (sports bras).  Air-dry your nipples for 3-4 minutes after each feeding.  Use only cotton bra pads to absorb leaked breast milk. Leaking of breast milk between feedings is normal.    Use lanolin on your nipples after breastfeeding. Lanolin helps to maintain your skin's normal moisture barrier. Pure lanolin is not harmful (not toxic) to your baby. You may also hand express a few drops of breast milk and gently massage that milk into your nipples and  allow the milk to air-dry.  In the first few weeks after giving birth, some women experience breast engorgement. Engorgement can make your breasts feel heavy, warm, and tender to the touch. Engorgement peaks within 3-5 days after you give birth. The following recommendations can help to ease engorgement:  Completely empty your breasts while breastfeeding or pumping. You may want to start by applying warm, moist heat (in the shower or with warm, water-soaked hand towels) just before feeding or pumping. This increases circulation and helps the milk flow. If your baby does not completely empty your breasts while breastfeeding, pump any extra milk after he or she is finished.  Apply ice packs to your breasts immediately after breastfeeding or pumping, unless this is too uncomfortable for you. To do this: ? Put ice in a plastic bag. ? Place a towel between your skin and the bag. ? Leave the ice on for 20 minutes, 2-3 times a day.  Make sure that your baby is latched on and positioned properly while breastfeeding.  If engorgement persists after 48 hours of following these recommendations, contact your health care provider or a lactation consultant. Overall health care recommendations while breastfeeding  Eat 3 healthy meals and 3 snacks every day. Well-nourished mothers who are breastfeeding need an additional 450-500 calories a day. You can meet this requirement by increasing the amount of a balanced diet that you eat.  Drink enough water to keep your urine pale yellow or clear.  Rest often, relax, and continue to take your prenatal vitamins to prevent fatigue, stress, and low vitamin and mineral levels in your body (nutrient deficiencies).  Do not use any products that contain nicotine or tobacco, such as cigarettes and e-cigarettes. Your baby may be harmed by chemicals from cigarettes that pass into breast milk and exposure to secondhand smoke. If you need help quitting, ask your health care  provider.  Avoid alcohol.  Do not use illegal drugs or marijuana.  Talk with your health care provider before taking any medicines. These include over-the-counter and prescription medicines as well as vitamins and herbal supplements. Some medicines that may be harmful to your baby can pass through breast milk.  It is possible to become pregnant while breastfeeding. If birth control is desired, ask your health care provider about options that will be safe while breastfeeding your baby. Where to find more information: [employer]: www.llli.org Contact a health care provider if:  You feel like you want to stop breastfeeding or have become frustrated with breastfeeding.  Your nipples are cracked or bleeding.  Your breasts are red, tender, or warm.  You have: ? Painful breasts or nipples. ? A swollen area on either breast. ? A fever or chills. ? Nausea or vomiting. ? Drainage other than breast milk from your nipples.  Your breasts do not become full before feedings by the fifth day after you give birth.  You feel sad and depressed.  Your baby is: ? Too sleepy to eat well. ? Having trouble sleeping. ? More than 1 week old and wetting fewer than 6 diapers in a 24-hour period. ? Not gaining weight by 5 days of age.  Your baby has fewer than 3 stools in a   24-hour period.  Your baby's skin or the white parts of his or her eyes become yellow. Get help right away if:  Your baby is overly tired (lethargic) and does not want to wake up and feed.  Your baby develops an unexplained fever. Summary  Breastfeeding offers many health benefits for infant and mothers.  Try to breastfeed your infant when he or she shows early signs of hunger.  Gently tickle or stroke your baby's lips with your finger or nipple to allow the baby to open his or her mouth. Bring the baby to your breast. Make sure that much of the areola is in your baby's mouth. Offer one side and burp the  baby before you offer the other side.  Talk with your health care provider or lactation consultant if you have questions or you face problems as you breastfeed. This information is not intended to replace advice given to you by your health care provider. Make sure you discuss any questions you have with your health care provider. Document Released: 04/14/2005 Document Revised: 05/16/2016 Document Reviewed: 05/16/2016 Elsevier Interactive Patient Education  2018 [employer] Inc.  

## 2017-05-05 ENCOUNTER — Other Ambulatory Visit: Payer: Self-pay | Admitting: *Deleted

## 2017-05-05 ENCOUNTER — Other Ambulatory Visit: Payer: Self-pay | Admitting: Obstetrics and Gynecology

## 2017-05-05 DIAGNOSIS — O219 Vomiting of pregnancy, unspecified: Secondary | ICD-10-CM

## 2017-05-05 LAB — CERVICOVAGINAL ANCILLARY ONLY
CHLAMYDIA, DNA PROBE: NEGATIVE
NEISSERIA GONORRHEA: NEGATIVE

## 2017-05-05 LAB — VITAMIN D 25 HYDROXY (VIT D DEFICIENCY, FRACTURES): VIT D 25 HYDROXY: 23 ng/mL — AB (ref 30.0–100.0)

## 2017-05-05 MED ORDER — VITAMIN D (ERGOCALCIFEROL) 1.25 MG (50000 UNIT) PO CAPS: 50000.0000 [IU] | ORAL_CAPSULE | ORAL | 3 refills | Status: DC

## 2017-05-05 MED ORDER — PROMETHAZINE HCL 25 MG PO TABS: 12.5000 mg | ORAL_TABLET | Freq: Four times a day (QID) | ORAL | 0 refills | Status: DC | PRN

## 2017-05-05 NOTE — Progress Notes (Signed)
Pt request refill on nausea medication. Refill sent per protocol.

## 2017-05-06 LAB — OBSTETRIC PANEL, INCLUDING HIV
ANTIBODY SCREEN: NEGATIVE
BASOS ABS: 0 10*3/uL (ref 0.0–0.2)
BASOS: 0 %
EOS (ABSOLUTE): 0.1 10*3/uL (ref 0.0–0.4)
Eos: 2 %
HEMATOCRIT: 36.7 % (ref 34.0–46.6)
HIV SCREEN 4TH GENERATION: NONREACTIVE
Hemoglobin: 12.5 g/dL (ref 11.1–15.9)
Hepatitis B Surface Ag: NEGATIVE
Immature Grans (Abs): 0 10*3/uL (ref 0.0–0.1)
Immature Granulocytes: 0 %
LYMPHS ABS: 1.8 10*3/uL (ref 0.7–3.1)
Lymphs: 27 %
MCH: 30.3 pg (ref 26.6–33.0)
MCHC: 34.1 g/dL (ref 31.5–35.7)
MCV: 89 fL (ref 79–97)
MONOCYTES: 6 %
Monocytes Absolute: 0.4 10*3/uL (ref 0.1–0.9)
NEUTROS ABS: 4.3 10*3/uL (ref 1.4–7.0)
Neutrophils: 65 %
PLATELETS: 358 10*3/uL (ref 150–379)
RBC: 4.13 x10E6/uL (ref 3.77–5.28)
RDW: 14.6 % (ref 12.3–15.4)
RPR Ser Ql: NONREACTIVE
RUBELLA: 1.45 {index} (ref >0.99)
Rh Factor: POSITIVE
WBC: 6.6 10*3/uL (ref 3.4–10.8)

## 2017-05-06 LAB — HEMOGLOBINOPATHY EVALUATION
HEMOGLOBIN F QUANTITATION: 0 % (ref 0.0–2.0)
HGB A: 97.5 % (ref 96.4–98.8)
HGB C: 0 %
HGB S: 0 %
HGB VARIANT: 0 %
Hemoglobin A2 Quantitation: 2.5 % (ref 1.8–3.2)

## 2017-05-07 ENCOUNTER — Other Ambulatory Visit: Payer: Self-pay | Admitting: Obstetrics and Gynecology

## 2017-05-07 DIAGNOSIS — Z3400 Encounter for supervision of normal first pregnancy, unspecified trimester: Secondary | ICD-10-CM

## 2017-05-08 LAB — URINE CULTURE, OB REFLEX

## 2017-05-08 LAB — SMN1 COPY NUMBER ANALYSIS (SMA CARRIER SCREENING)

## 2017-05-08 LAB — CULTURE, OB URINE

## 2017-05-12 LAB — CYSTIC FIBROSIS MUTATION 97: GENE DIS ANAL CARRIER INTERP BLD/T-IMP: NOT DETECTED

## 2017-05-25 ENCOUNTER — Encounter: Payer: Self-pay | Admitting: Obstetrics and Gynecology

## 2017-06-01 ENCOUNTER — Encounter: Payer: Self-pay | Admitting: Obstetrics and Gynecology

## 2017-06-08 ENCOUNTER — Ambulatory Visit (INDEPENDENT_AMBULATORY_CARE_PROVIDER_SITE_OTHER): Payer: Medicaid Other | Admitting: Obstetrics & Gynecology

## 2017-06-08 VITALS — BP 106/69 | HR 88 | Wt 109.9 lb

## 2017-06-08 DIAGNOSIS — Z3402 Encounter for supervision of normal first pregnancy, second trimester: Secondary | ICD-10-CM

## 2017-06-08 DIAGNOSIS — Z3400 Encounter for supervision of normal first pregnancy, unspecified trimester: Secondary | ICD-10-CM

## 2017-06-08 DIAGNOSIS — Z3689 Encounter for other specified antenatal screening: Secondary | ICD-10-CM

## 2017-06-08 DIAGNOSIS — O219 Vomiting of pregnancy, unspecified: Secondary | ICD-10-CM

## 2017-06-08 MED ORDER — METOCLOPRAMIDE HCL 10 MG PO TABS: 10.0000 mg | ORAL_TABLET | Freq: Four times a day (QID) | ORAL | 2 refills | Status: DC | PRN

## 2017-06-08 MED ORDER — PROMETHAZINE HCL 25 MG PO TABS: 12.5000 mg | ORAL_TABLET | Freq: Four times a day (QID) | ORAL | 3 refills | Status: DC | PRN

## 2017-06-08 NOTE — Patient Instructions (Signed)

## 2017-06-08 NOTE — Progress Notes (Addendum)
   PRENATAL VISIT NOTE  Subjective:  Rachel DutyLizbeth Harney is a 22 y.o. G3P0020 at 75102w0d being seen today for ongoing prenatal care.  She is currently monitored for the following issues for this low-risk pregnancy and has Supervision of normal first pregnancy, antepartum and History of herpes genitalis on their problem list.  Patient reports no complaints; just desires refills of antiemetics for occasional nausea.  Contractions: Not present. Vag. Bleeding: None.   . Denies leaking of fluid.   The following portions of the patient's history were reviewed and updated as appropriate: allergies, current medications, past family history, past medical history, past social history, past surgical history and problem list. Problem list updated.  Objective:   Vitals:   06/08/17 1039  BP: 106/69  Pulse: 88  Weight: 109 lb 14.4 oz (49.9 kg)    Fetal Status: Fetal Heart Rate (bpm): 162         General:  Alert, oriented and cooperative. Patient is in no acute distress.  Skin: Skin is warm and dry. No rash noted.   Cardiovascular: Normal heart rate noted  Respiratory: Normal respiratory effort, no problems with respiration noted  Abdomen: Soft, gravid, appropriate for gestational age.  Pain/Pressure: Absent     Pelvic: Cervical exam deferred        Extremities: Normal range of motion.  Edema: None  Mental Status:  Normal mood and affect. Normal behavior. Normal judgment and thought content.   Assessment and Plan:  Pregnancy: G3P0020 at 72102w0d  1. Nausea and vomiting in pregnancy prior to [redacted] weeks gestation Antiemetics ordered. - promethazine (PHENERGAN) 25 MG tablet; Take 0.5-1 tablets (12.5-25 mg total) by mouth every 6 (six) hours as needed.  Dispense: 30 tablet; Refill: 3 - metoCLOPramide (REGLAN) 10 MG tablet; Take 1 tablet (10 mg total) by mouth 4 (four) times daily as needed for nausea or vomiting.  Dispense: 30 tablet; Refill: 2  2. Encounter for fetal anatomic survey 3. Supervision of  normal first pregnancy, antepartum Declined all modalities of genetic screening.  Anatomy scan ordered. - US MFM OB COMP + 14 WK; Future No other complaints or concerns.  Routine obstetric precautions reviewed. Please refer to After Visit Summary for other counseling recommendations.  Return in about 4 weeks (around 07/06/2017) for OB Visit.   Jaynie CollinsUgonna Shaterica Mcclatchy, MD

## 2017-06-16 ENCOUNTER — Inpatient Hospital Stay (HOSPITAL_COMMUNITY)
Admission: AD | Admit: 2017-06-16 | Discharge: 2017-06-16 | Disposition: A | Discharge status: Home / Self Care | Payer: Medicaid Other | Source: Ambulatory Visit | Attending: Family Medicine | Admitting: Family Medicine

## 2017-06-16 ENCOUNTER — Inpatient Hospital Stay (HOSPITAL_COMMUNITY): Payer: Medicaid Other

## 2017-06-16 ENCOUNTER — Encounter (HOSPITAL_COMMUNITY): Payer: Self-pay | Admitting: *Deleted

## 2017-06-16 DIAGNOSIS — O98512 Other viral diseases complicating pregnancy, second trimester: Secondary | ICD-10-CM | POA: Insufficient documentation

## 2017-06-16 DIAGNOSIS — N930 Postcoital and contact bleeding: Secondary | ICD-10-CM

## 2017-06-16 DIAGNOSIS — B009 Herpesviral infection, unspecified: Secondary | ICD-10-CM | POA: Diagnosis not present

## 2017-06-16 DIAGNOSIS — Z3A16 16 weeks gestation of pregnancy: Secondary | ICD-10-CM | POA: Diagnosis present

## 2017-06-16 DIAGNOSIS — O4692 Antepartum hemorrhage, unspecified, second trimester: Secondary | ICD-10-CM | POA: Insufficient documentation

## 2017-06-16 LAB — URINALYSIS, ROUTINE W REFLEX MICROSCOPIC
Bacteria, UA: NONE SEEN
Bilirubin Urine: NEGATIVE
Glucose, UA: NEGATIVE mg/dL
Ketones, ur: 5 mg/dL — AB
Leukocytes, UA: NEGATIVE
Nitrite: NEGATIVE
Protein, ur: NEGATIVE mg/dL
Specific Gravity, Urine: 1.017 (ref 1.005–1.030)
pH: 5 (ref 5.0–8.0)

## 2017-06-16 NOTE — Discharge Instructions (Signed)
Vaginal Bleeding During Pregnancy, Second Trimester A small amount of bleeding (spotting) from the vagina is common in pregnancy. Sometimes the bleeding is normal and is not a problem, and sometimes it is a sign of something serious. Be sure to tell your doctor about any bleeding from your vagina right away. Follow these instructions at home:  Watch your condition for any changes.  Follow your doctor's instructions about how active you can be.  If you are on bed rest: ? You may need to stay in bed and only get up to use the bathroom. ? You may be allowed to do some activities. ? If you need help, make plans for someone to help you.  Write down: ? The number of pads you use each day. ? How often you change pads. ? How soaked (saturated) your pads are.  Do not use tampons.  Do not douche.  Do not have sex or orgasms until your doctor says it is okay.  If you pass any tissue from your vagina, save the tissue so you can show it to your doctor.  Only take medicines as told by your doctor.  Do not take aspirin because it can make you bleed.  Do not exercise, lift heavy weights, or do any activities that take a lot of energy and effort unless your doctor says it is okay.  Keep all follow-up visits as told by your doctor. Contact a doctor if:  You bleed from your vagina.  You have cramps.  You have labor pains.  You have a fever that does not go away after you take medicine. Get help right away if:  You have very bad cramps in your back or belly (abdomen).  You have contractions.  You have chills.  You pass large clots or tissue from your vagina.  You bleed more.  You feel light-headed or weak.  You pass out (faint).  You are leaking fluid or have a gush of fluid from your vagina. This information is not intended to replace advice given to you by your health care provider. Make sure you discuss any questions you have with your health care provider. Document  Released: 08/29/2013 Document Revised: 09/20/2015 Document Reviewed: 12/20/2012 Elsevier Interactive Patient Education  2018 [employer]  

## 2017-06-16 NOTE — [facility] Note (Signed)
Pt presents to [facility] with complaints of vaginal bleeding that started today after intercourse. Denies any bleeding at presents.

## 2017-06-16 NOTE — MAU Provider Note (Signed)
Chief Complaint: Vaginal Bleeding   First Provider Initiated Contact with Patient 06/16/17 1808     SUBJECTIVE HPI: Rachel Olsen is a 22 y.o. G3P0020 at 6052w1d by LMP who presents to maternity admissions reporting vaginal bleeding. She reports vaginal spotting bright red blood after intercourse around 1700. She reports not having to wear a pad or liner for spotting. She reports having a subchorionic hemorrhage at 6 weeks when she was seen for vaginal bleeding in MAU. She denies abdominal pain or cramping. She denies vaginal itching/burning, urinary symptoms, h/a, dizziness, n/v, or fever/chills.    Past Medical History:  Diagnosis Date  . Genital herpes    Past Surgical History:  Procedure Laterality Date  . NO PAST SURGERIES     Social History   Socioeconomic History  . Marital status: Single    Spouse name: Not on file  . Number of children: Not on file  . Years of education: Not on file  . Highest education level: Not on file  Social Needs  . Financial resource strain: Not on file  . Food insecurity - worry: Not on file  . Food insecurity - inability: Not on file  . Transportation needs - medical: Not on file  . Transportation needs - non-medical: Not on file  Occupational History  . Not on file  Tobacco Use  . Smoking status: Never Smoker  . Smokeless tobacco: Never Used  Substance and Sexual Activity  . Alcohol use: No    Frequency: Never  . Drug use: No  . Sexual activity: Yes    Birth control/protection: None  Other Topics Concern  . Not on file  Social History Narrative  . Not on file   No current facility-administered medications on file prior to encounter.    Current Outpatient Medications on File Prior to Encounter  Medication Sig Dispense Refill  . metoCLOPramide (REGLAN) 10 MG tablet Take 1 tablet (10 mg total) by mouth 4 (four) times daily as needed for nausea or vomiting. 30 tablet 2  . Prenatal Vit-Fe Phos-FA-Omega (VITAFOL GUMMIES)  3.33-0.333-34.8 MG CHEW Chew 1 tablet by mouth daily. (Patient taking differently: Chew 3 tablets by mouth daily. ) 90 tablet 3  . promethazine (PHENERGAN) 25 MG tablet Take 0.5-1 tablets (12.5-25 mg total) by mouth every 6 (six) hours as needed. 30 tablet 3  . Vitamin D, Ergocalciferol, (DRISDOL) 50000 units CAPS capsule Take 1 capsule (50,000 Units total) by mouth every 7 (seven) days. (Patient not taking: Reported on 06/08/2017) 4 capsule 3   No Known Allergies  ROS:  Review of Systems  Constitutional: Negative.   Respiratory: Negative.   Cardiovascular: Negative.   Gastrointestinal: Negative.   Genitourinary: Positive for vaginal bleeding. Negative for difficulty urinating, dysuria, frequency, pelvic pain, urgency and vaginal pain.  Musculoskeletal: Negative.   Neurological: Negative.   Psychiatric/Behavioral: Negative.    I have reviewed patient's Past Medical Hx, Surgical Hx, Family Hx, Social Hx, medications and allergies.   Physical Exam   Patient Vitals for the past 24 hrs:  BP Temp Pulse Resp Height Weight  06/16/17 1755 115/62 98.4 F (36.9 C) 98 18 5\' 2"  (1.575 m) 109 lb (49.4 kg)   Constitutional: Well-developed, well-nourished female in no acute distress.  Cardiovascular: normal rate Respiratory: normal effort GI: Abd soft, non-tender. Pos BS x 4 MS: Extremities nontender, no edema, normal ROM Neurologic: Alert and oriented x 4.  GU: Neg CVAT.  PELVIC EXAM: Cervix pink, visually closed, without lesion, scant pink discharge present, vaginal  walls and external genitalia normal Bimanual exam: Cervix 0/long/high, firm, anterior, neg CMT, uterus nontender, nonenlarged, adnexa without tenderness, enlargement, or mass  FHT 159 by doppler  LAB RESULTS Results for orders placed or performed during the hospital encounter of 06/16/17 (from the past 24 hour(s))  Urinalysis, Routine w reflex microscopic     Status: Abnormal   Collection Time: 06/16/17  5:44 PM  Result Value  Ref Range   Color, Urine YELLOW YELLOW   APPearance CLEAR CLEAR   Specific Gravity, Urine 1.017 1.005 - 1.030   pH 5.0 5.0 - 8.0   Glucose, UA NEGATIVE NEGATIVE mg/dL   Hgb urine dipstick SMALL (A) NEGATIVE   Bilirubin Urine NEGATIVE NEGATIVE   Ketones, ur 5 (A) NEGATIVE mg/dL   Protein, ur NEGATIVE NEGATIVE mg/dL   Nitrite NEGATIVE NEGATIVE   Leukocytes, UA NEGATIVE NEGATIVE   RBC / HPF 0-5 0 - 5 RBC/hpf   WBC, UA 0-5 0 - 5 WBC/hpf   Bacteria, UA NONE SEEN NONE SEEN   Squamous Epithelial / LPF 0-5 (A) NONE SEEN   Mucus PRESENT     O/Positive/-- (01/07 1103)  IMAGING [place] Mfm Ob Limited  Result Date: 06/17/2017 ----------------------------------------------------------------------  OBSTETRICS REPORT                      (Signed Final 06/17/2017 08:10 am) ---------------------------------------------------------------------- Patient Info  ID #:       [id number]                          D.O.B.:  [birth year] (21 yrs)  Name:       Rachel Olsen               Visit Date: 06/16/2017 06:32 pm ---------------------------------------------------------------------- Performed By  Performed By:     Hurman Horn          Referred By:      MAU Nursing-                    RDMS                                     MAU/Triage  Attending:        Charlsie Merles MD         Location:         [facility] ---------------------------------------------------------------------- Orders   #  Description                                 Code   1  [place] MFM OB LIMITED                           [phone]  ----------------------------------------------------------------------   #  Ordered By               Order #        Accession #    Episode #   1  Steward Drone          [id number]      [id number]     [id number]  ---------------------------------------------------------------------- Indications   [redacted] weeks gestation of pregnancy                Z3A.16   Vaginal bleeding in pregnancy, second  O46.92   trimester   ---------------------------------------------------------------------- OB History  Gravidity:    3         Term:   0        Prem:   0        SAB:   1  TOP:          1       Ectopic:  0        Living: 0 ---------------------------------------------------------------------- Fetal Evaluation  Num Of Fetuses:     1  Fetal Heart         158  Rate(bpm):  Cardiac Activity:   Observed  Presentation:       Variable  Placenta:           Anterior, above cervical os  P. Cord Insertion:  Visualized, central  Amniotic Fluid  AFI FV:      Subjectively within normal limits                              Largest Pocket(cm)                              3.96  Comment:    Stomach and bladder seen. ---------------------------------------------------------------------- Gestational Age  LMP:           18w 1d        Date:  02/09/17                 EDD:   11/16/17  Best:          16w 1d     Det. By:  Previous Ultrasound      EDD:   11/30/17                                      (04/07/17) ---------------------------------------------------------------------- Impression  Singleton intrauterine pregnancy at 16+1 weeks with vaginal  bleeding here for requested limited scan  Normal feal movement and cardiac activity  Normal placentation without previa or markers for placental  separation  Amniotic fluid volume is normal  Presentation was variable throughout the exam ---------------------------------------------------------------------- Recommendations  Continue clinical evaluation and management. ----------------------------------------------------------------------                 Charlsie Merles, MD Electronically Signed Final Report   06/17/2017 08:10 am ----------------------------------------------------------------------   MAU Management/MDM: Orders Placed This Encounter  Procedures  . [place] MFM OB LIMITED  . Urinalysis, Routine w reflex microscopic  UA- WNL, mild ketones present on urine  US- WNL, no abnormalities seen- anterior placenta  above cervical os   Pt discharged with strict bleeding precautions and pelvic rest.   ASSESSMENT 1. Bleeding after intercourse   2. Vaginal bleeding in pregnancy, second trimester     PLAN Discharge home. Pt stable at time of discharge.  Follow up as scheduled for prenatal visits  Return to MAU as needed for increased bleeding and having to change pads  Educated on pelvic rest while bleeding.    Allergies as of 06/16/2017   No Known Allergies     Medication List    TAKE these medications   metoCLOPramide 10 MG tablet Commonly known as:  REGLAN Take 1 tablet (10 mg total) by mouth 4 (four) times daily as needed for nausea or vomiting.   promethazine 25  MG tablet Commonly known as:  PHENERGAN Take 0.5-1 tablets (12.5-25 mg total) by mouth every 6 (six) hours as needed.   VITAFOL GUMMIES 3.33-0.333-34.8 MG Chew Chew 1 tablet by mouth daily. What changed:  how much to take   Vitamin D (Ergocalciferol) 50000 units Caps capsule Commonly known as:  DRISDOL Take 1 capsule (50,000 Units total) by mouth every 7 (seven) days.      Steward Drone  Certified Nurse-Midwife 06/16/2017  6:24 PM

## 2017-07-06 ENCOUNTER — Encounter: Payer: Self-pay | Admitting: Obstetrics and Gynecology

## 2017-07-06 ENCOUNTER — Other Ambulatory Visit (HOSPITAL_COMMUNITY)
Admission: RE | Admit: 2017-07-06 | Discharge: 2017-07-06 | Disposition: A | Discharge status: Home / Self Care | Payer: Medicaid Other | Source: Ambulatory Visit | Attending: Obstetrics and Gynecology | Admitting: Obstetrics and Gynecology

## 2017-07-06 ENCOUNTER — Ambulatory Visit (INDEPENDENT_AMBULATORY_CARE_PROVIDER_SITE_OTHER): Payer: Medicaid Other | Admitting: Obstetrics and Gynecology

## 2017-07-06 VITALS — BP 124/76 | HR 94 | Wt 115.2 lb

## 2017-07-06 DIAGNOSIS — Z8619 Personal history of other infectious and parasitic diseases: Secondary | ICD-10-CM

## 2017-07-06 DIAGNOSIS — B9689 Other specified bacterial agents as the cause of diseases classified elsewhere: Secondary | ICD-10-CM | POA: Diagnosis not present

## 2017-07-06 DIAGNOSIS — Z3400 Encounter for supervision of normal first pregnancy, unspecified trimester: Secondary | ICD-10-CM | POA: Insufficient documentation

## 2017-07-06 DIAGNOSIS — B373 Candidiasis of vulva and vagina: Secondary | ICD-10-CM | POA: Diagnosis not present

## 2017-07-06 NOTE — Progress Notes (Signed)
Pt c/o outer vulva itching and requests to r/o yeast infection.

## 2017-07-06 NOTE — Addendum Note (Signed)
Addended by: Dalphine HandingGARDNER, Brealynn Contino L on: 07/06/2017 02:34 PM   Modules accepted: Orders

## 2017-07-06 NOTE — Progress Notes (Signed)
   PRENATAL VISIT NOTE  Subjective:  Rachel Olsen is a 22 y.o. G3P0020 at 3264w0d being seen today for ongoing prenatal care.  She is currently monitored for the following issues for this low-risk pregnancy and has Supervision of normal first pregnancy, antepartum and History of herpes genitalis on their problem list.  Patient reports vaginal pruritis for the past few days.  Contractions: Irritability. Vag. Bleeding: None.  Movement: Present. Denies leaking of fluid.   The following portions of the patient's history were reviewed and updated as appropriate: allergies, current medications, past family history, past medical history, past social history, past surgical history and problem list. Problem list updated.  Objective:   Vitals:   07/06/17 1017  BP: 124/76  Pulse: 94  Weight: 115 lb 3.2 oz (52.3 kg)    Fetal Status: Fetal Heart Rate (bpm): 150   Movement: Present     General:  Alert, oriented and cooperative. Patient is in no acute distress.  Skin: Skin is warm and dry. No rash noted.   Cardiovascular: Normal heart rate noted  Respiratory: Normal respiratory effort, no problems with respiration noted  Abdomen: Soft, gravid, appropriate for gestational age.  Pain/Pressure: Absent     Pelvic: Cervical exam deferred        Extremities: Normal range of motion.  Edema: None  Mental Status:  Normal mood and affect. Normal behavior. Normal judgment and thought content.   Assessment and Plan:  Pregnancy: G3P0020 at 1564w0d  1. Supervision of normal first pregnancy, antepartum Patient is doing well  Cultures collected  Patient scheduled for anatomy ultrasound tomorrow  2. History of herpes genitalis Will offer prophylaxis at 34-36 weeks  Preterm labor symptoms and general obstetric precautions including but not limited to vaginal bleeding, contractions, leaking of fluid and fetal movement were reviewed in detail with the patient. Please refer to After Visit Summary for other  counseling recommendations.  Return in about 4 weeks (around 08/03/2017) for ROB.   Catalina AntiguaPeggy Demyah Smyre, MD

## 2017-07-07 ENCOUNTER — Ambulatory Visit (HOSPITAL_COMMUNITY)
Admission: RE | Admit: 2017-07-07 | Discharge: 2017-07-07 | Disposition: A | Discharge status: Home / Self Care | Payer: Medicaid Other | Source: Ambulatory Visit | Attending: Obstetrics & Gynecology | Admitting: Obstetrics & Gynecology

## 2017-07-07 ENCOUNTER — Other Ambulatory Visit: Payer: Self-pay | Admitting: Obstetrics & Gynecology

## 2017-07-07 DIAGNOSIS — Z3689 Encounter for other specified antenatal screening: Secondary | ICD-10-CM

## 2017-07-07 DIAGNOSIS — Z3400 Encounter for supervision of normal first pregnancy, unspecified trimester: Secondary | ICD-10-CM

## 2017-07-07 DIAGNOSIS — Z3A19 19 weeks gestation of pregnancy: Secondary | ICD-10-CM | POA: Diagnosis not present

## 2017-07-07 LAB — CERVICOVAGINAL ANCILLARY ONLY
BACTERIAL VAGINITIS: POSITIVE — AB
CANDIDA VAGINITIS: POSITIVE — AB
Chlamydia: NEGATIVE
Neisseria Gonorrhea: NEGATIVE
TRICH (WINDOWPATH): NEGATIVE

## 2017-07-08 ENCOUNTER — Other Ambulatory Visit: Payer: Self-pay | Admitting: Obstetrics and Gynecology

## 2017-07-08 MED ORDER — FLUCONAZOLE 150 MG PO TABS: 150.0000 mg | ORAL_TABLET | Freq: Once | ORAL | 0 refills | Status: AC

## 2017-07-08 MED ORDER — METRONIDAZOLE 500 MG PO TABS: 500.0000 mg | ORAL_TABLET | Freq: Two times a day (BID) | ORAL | 0 refills | Status: DC

## 2017-08-03 ENCOUNTER — Ambulatory Visit (INDEPENDENT_AMBULATORY_CARE_PROVIDER_SITE_OTHER): Payer: Medicaid Other | Admitting: Obstetrics and Gynecology

## 2017-08-03 ENCOUNTER — Encounter: Payer: Self-pay | Admitting: Obstetrics and Gynecology

## 2017-08-03 VITALS — BP 122/76 | HR 96 | Wt 120.4 lb

## 2017-08-03 DIAGNOSIS — Z8619 Personal history of other infectious and parasitic diseases: Secondary | ICD-10-CM

## 2017-08-03 DIAGNOSIS — Z3482 Encounter for supervision of other normal pregnancy, second trimester: Secondary | ICD-10-CM

## 2017-08-03 DIAGNOSIS — Z3400 Encounter for supervision of normal first pregnancy, unspecified trimester: Secondary | ICD-10-CM

## 2017-08-03 NOTE — Progress Notes (Signed)
   PRENATAL VISIT NOTE  Subjective:  Rachel Olsen is a 22 y.o. G3P0020 at 6593w0d being seen today for ongoing prenatal care.  She is currently monitored for the following issues for this low-risk pregnancy and has Supervision of normal first pregnancy, antepartum and History of herpes genitalis on their problem list.  Patient reports no complaints.  Contractions: Not present. Vag. Bleeding: None.  Movement: Present. Denies leaking of fluid.   The following portions of the patient's history were reviewed and updated as appropriate: allergies, current medications, past family history, past medical history, past social history, past surgical history and problem list. Problem list updated.  Objective:   Vitals:   08/03/17 1039  BP: 122/76  Pulse: 96  Weight: 120 lb 6.4 oz (54.6 kg)    Fetal Status: Fetal Heart Rate (bpm): 159 Fundal Height: 23 cm Movement: Present     General:  Alert, oriented and cooperative. Patient is in no acute distress.  Skin: Skin is warm and dry. No rash noted.   Cardiovascular: Normal heart rate noted  Respiratory: Normal respiratory effort, no problems with respiration noted  Abdomen: Soft, gravid, appropriate for gestational age.  Pain/Pressure: Present     Pelvic: Cervical exam deferred        Extremities: Normal range of motion.  Edema: None  Mental Status: Normal mood and affect. Normal behavior. Normal judgment and thought content.   Assessment and Plan:  Pregnancy: G3P0020 at 7293w0d  1. Supervision of normal first pregnancy, antepartum Patient is doing well without complaints Normal anatomy ultrasound Third trimester labs, glucola and tdap next visit  2. History of herpes genitalis Prophylaxis will be offered  Preterm labor symptoms and general obstetric precautions including but not limited to vaginal bleeding, contractions, leaking of fluid and fetal movement were reviewed in detail with the patient. Please refer to After Visit Summary for  other counseling recommendations.  Return in about 1 month (around 08/31/2017) for ROB, 2 hr glucola next visit.  No future appointments.  Catalina AntiguaPeggy Treyton Slimp, MD

## 2017-09-01 ENCOUNTER — Ambulatory Visit (INDEPENDENT_AMBULATORY_CARE_PROVIDER_SITE_OTHER): Payer: Medicaid Other | Admitting: Certified Nurse Midwife

## 2017-09-01 ENCOUNTER — Other Ambulatory Visit: Payer: Self-pay

## 2017-09-01 VITALS — BP 108/71 | HR 88 | Wt 120.0 lb

## 2017-09-01 DIAGNOSIS — Z8619 Personal history of other infectious and parasitic diseases: Secondary | ICD-10-CM

## 2017-09-01 DIAGNOSIS — Z3400 Encounter for supervision of normal first pregnancy, unspecified trimester: Secondary | ICD-10-CM

## 2017-09-01 MED ORDER — VALACYCLOVIR HCL 1 G PO TABS: 1000.0000 mg | ORAL_TABLET | Freq: Two times a day (BID) | ORAL | 2 refills | Status: DC

## 2017-09-01 NOTE — Progress Notes (Signed)
   PRENATAL VISIT NOTE  Subjective:  Rachel Olsen is a 22 y.o. G3P0020 at [redacted]w[redacted]d being seen today for ongoing prenatal care.  She is currently monitored for the following issues for this low-risk pregnancy and has Supervision of normal first pregnancy, antepartum and History of herpes genitalis on their problem list.  Patient reports no bleeding, no contractions, no cramping, no leaking and occasional round ligament pain, herpes outbreak started over the weekend.  Contractions: Not present. Vag. Bleeding: None.  Movement: Present. Denies leaking of fluid.   The following portions of the patient's history were reviewed and updated as appropriate: allergies, current medications, past family history, past medical history, past social history, past surgical history and problem list. Problem list updated.  Objective:   Vitals:   09/01/17 0825  BP: 108/71  Pulse: 88  Weight: 120 lb (54.4 kg)    Fetal Status: Fetal Heart Rate (bpm): 147; doppler Fundal Height: 27 cm Movement: Present     General:  Alert, oriented and cooperative. Patient is in no acute distress.  Skin: Skin is warm and dry. No rash noted.   Cardiovascular: Normal heart rate noted  Respiratory: Normal respiratory effort, no problems with respiration noted  Abdomen: Soft, gravid, appropriate for gestational age.  Pain/Pressure: Absent     Pelvic: Cervical exam deferred        Extremities: Normal range of motion.  Edema: None  Mental Status: Normal mood and affect. Normal behavior. Normal judgment and thought content.   Assessment and Plan:  Pregnancy: G3P0020 at [redacted]w[redacted]d  1. Supervision of normal first pregnancy, antepartum     2 hour OGTT today.  Prenatal class resources given.  Peds list given.  - Glucose Tolerance, 2 Hours w/1 Hour - CBC - HIV antibody - RPR  2. History of herpes genitalis     Suppression until delivery - valACYclovir (VALTREX) 1000 MG tablet; Take 1 tablet (1,000 mg total) by mouth 2 (two) times  daily.  Dispense: 60 tablet; Refill: 2   Preterm labor symptoms and general obstetric precautions including but not limited to vaginal bleeding, contractions, leaking of fluid and fetal movement were reviewed in detail with the patient. Please refer to After Visit Summary for other counseling recommendations.  Return in about 2 weeks (around 09/15/2017) for ROB.    Roe Coombs, CNM

## 2017-09-01 NOTE — Progress Notes (Signed)
Declined TDAP. C/o herpes outbreak, vaginal blisters, itching and pain 7/10 x 4 days.

## 2017-09-02 LAB — CBC
HEMATOCRIT: 29.4 % — AB (ref 34.0–46.6)
Hemoglobin: 9.5 g/dL — ABNORMAL LOW (ref 11.1–15.9)
MCH: 28.3 pg (ref 26.6–33.0)
MCHC: 32.3 g/dL (ref 31.5–35.7)
MCV: 88 fL (ref 79–97)
PLATELETS: 363 10*3/uL (ref 150–379)
RBC: 3.36 x10E6/uL — ABNORMAL LOW (ref 3.77–5.28)
RDW: 13.8 % (ref 12.3–15.4)
WBC: 9.4 10*3/uL (ref 3.4–10.8)

## 2017-09-02 LAB — GLUCOSE TOLERANCE, 2 HOURS W/ 1HR
GLUCOSE, 2 HOUR: 122 mg/dL (ref 65–152)
GLUCOSE, FASTING: 81 mg/dL (ref 65–91)
Glucose, 1 hour: 139 mg/dL (ref 65–179)

## 2017-09-02 LAB — HIV ANTIBODY (ROUTINE TESTING W REFLEX): HIV Screen 4th Generation wRfx: NONREACTIVE

## 2017-09-02 LAB — RPR: RPR Ser Ql: NONREACTIVE

## 2017-09-07 ENCOUNTER — Other Ambulatory Visit: Payer: Medicaid Other

## 2017-09-07 ENCOUNTER — Other Ambulatory Visit: Payer: Self-pay | Admitting: Certified Nurse Midwife

## 2017-09-07 ENCOUNTER — Encounter: Payer: Medicaid Other | Admitting: Certified Nurse Midwife

## 2017-09-07 DIAGNOSIS — O99013 Anemia complicating pregnancy, third trimester: Secondary | ICD-10-CM

## 2017-09-07 DIAGNOSIS — Z3400 Encounter for supervision of normal first pregnancy, unspecified trimester: Secondary | ICD-10-CM

## 2017-09-07 MED ORDER — CITRANATAL BLOOM 90-1 MG PO TABS: 1.0000 | ORAL_TABLET | Freq: Every day | ORAL | 12 refills | Status: AC

## 2017-09-15 ENCOUNTER — Ambulatory Visit (INDEPENDENT_AMBULATORY_CARE_PROVIDER_SITE_OTHER): Payer: Medicaid Other | Admitting: Certified Nurse Midwife

## 2017-09-15 ENCOUNTER — Encounter: Payer: Self-pay | Admitting: Certified Nurse Midwife

## 2017-09-15 VITALS — BP 115/65 | HR 93 | Wt 123.0 lb

## 2017-09-15 DIAGNOSIS — O99019 Anemia complicating pregnancy, unspecified trimester: Secondary | ICD-10-CM | POA: Insufficient documentation

## 2017-09-15 DIAGNOSIS — Z3483 Encounter for supervision of other normal pregnancy, third trimester: Secondary | ICD-10-CM

## 2017-09-15 DIAGNOSIS — Z3400 Encounter for supervision of normal first pregnancy, unspecified trimester: Secondary | ICD-10-CM

## 2017-09-15 DIAGNOSIS — O99013 Anemia complicating pregnancy, third trimester: Secondary | ICD-10-CM

## 2017-09-15 DIAGNOSIS — Z8619 Personal history of other infectious and parasitic diseases: Secondary | ICD-10-CM

## 2017-09-15 DIAGNOSIS — D649 Anemia, unspecified: Secondary | ICD-10-CM

## 2017-09-15 NOTE — Patient Instructions (Signed)
Iron-Rich Diet Iron is a mineral that helps your body to produce hemoglobin. Hemoglobin is a protein in your red blood cells that carries oxygen to your body's tissues. Eating too little iron may cause you to feel weak and tired, and it can increase your risk for infection. Eating enough iron is necessary for your body's metabolism, muscle function, and nervous system. Iron is naturally found in many foods. It can also be added to foods or fortified in foods. There are two types of dietary iron:  Heme iron. Heme iron is absorbed by the body more easily than nonheme iron. Heme iron is found in meat, poultry, and fish.  Nonheme iron. Nonheme iron is found in dietary supplements, iron-fortified grains, beans, and vegetables.  You may need to follow an iron-rich diet if:  You have been diagnosed with iron deficiency or iron-deficiency anemia.  You have a condition that prevents you from absorbing dietary iron, such as: ? Infection in your intestines. ? Celiac disease. This involves long-lasting (chronic) inflammation of your intestines.  You do not eat enough iron.  You eat a diet that is high in foods that impair iron absorption.  You have lost a lot of blood.  You have heavy bleeding during your menstrual cycle.  You are pregnant.  What is my plan? Your health care provider may help you to determine how much iron you need per day based on your condition. Generally, when a person consumes sufficient amounts of iron in the diet, the following iron needs are met:  Men. ? 14-18 years old: 11 mg per day. ? 19-50 years old: 8 mg per day.  Women. ? 14-18 years old: 15 mg per day. ? 19-50 years old: 18 mg per day. ? Over 50 years old: 8 mg per day. ? Pregnant women: 27 mg per day. ? Breastfeeding women: 9 mg per day.  What do I need to know about an iron-rich diet?  Eat fresh fruits and vegetables that are high in vitamin C along with foods that are high in iron. This will help  increase the amount of iron that your body absorbs from food, especially with foods containing nonheme iron. Foods that are high in vitamin C include oranges, peppers, tomatoes, and mango.  Take iron supplements only as directed by your health care provider. Overdose of iron can be life-threatening. If you were prescribed iron supplements, take them with orange juice or a vitamin C supplement.  Cook foods in pots and pans that are made from iron.  Eat nonheme iron-containing foods alongside foods that are high in heme iron. This helps to improve your iron absorption.  Certain foods and drinks contain compounds that impair iron absorption. Avoid eating these foods in the same meal as iron-rich foods or with iron supplements. These include: ? Coffee, black tea, and red wine. ? Milk, dairy products, and foods that are high in calcium. ? Beans, soybeans, and peas. ? Whole grains.  When eating foods that contain both nonheme iron and compounds that impair iron absorption, follow these tips to absorb iron better. ? Soak beans overnight before cooking. ? Soak whole grains overnight and drain them before using. ? Ferment flours before baking, such as using yeast in bread dough. What foods can I eat? Grains Iron-fortified breakfast cereal. Iron-fortified whole-wheat bread. Enriched rice. Sprouted grains. Vegetables Spinach. Potatoes with skin. Green peas. Broccoli. Red and green bell peppers. Fermented vegetables. Fruits Prunes. Raisins. Oranges. Strawberries. Mango. Grapefruit. Meats and Other Protein Sources   Beef liver. Oysters. Beef. Shrimp. [place]. Chicken. [place]. Sardines. Chickpeas. Nuts. Tofu. Beverages Tomato juice. Fresh orange juice. Prune juice. Hibiscus tea. Fortified instant breakfast shakes. Condiments Tahini. Fermented soy sauce. Sweets and Desserts Black-strap molasses. Other Wheat germ. The items listed above may not be a complete list of recommended foods or beverages.  Contact your dietitian for more options. What foods are not recommended? Grains Whole grains. Bran cereal. Bran flour. Oats. Vegetables Artichokes. Brussels sprouts. Kale. Fruits Blueberries. Raspberries. Strawberries. Figs. Meats and Other Protein Sources Soybeans. Products made from soy protein. Dairy Milk. Cream. Cheese. Yogurt. Cottage cheese. Beverages Coffee. Black tea. Red wine. Sweets and Desserts Cocoa. Chocolate. Ice cream. Other Basil. Oregano. Parsley. The items listed above may not be a complete list of foods and beverages to avoid. Contact your dietitian for more information. This information is not intended to replace advice given to you by your health care provider. Make sure you discuss any questions you have with your health care provider. Document Released: 11/26/2004 Document Revised: 11/02/2015 Document Reviewed: 11/09/2013 Elsevier Interactive Patient Education  2018 [place] Before your baby arrives it is important to:  Have all of the supplies that you will need to care for your baby.  Know where to go if there is an emergency.  Discuss the baby's arrival with other family members.  What supplies will I need?  It is recommended that you have the following supplies: Large Items  Crib.  Crib mattress.  Rear-facing infant car seat. If possible, have a trained professional check to make sure that it is installed correctly.  Feeding  6-8 bottles that are 4-5 oz in size.  6-8 nipples.  Bottle brush.  Sterilizer, or a large pan or kettle with a lid.  A way to boil and cool water.  If you will be breastfeeding: ? Breast pump. ? Nipple cream. ? Nursing bra. ? Breast pads. ? Breast shields.  If you will be formula feeding: ? Formula. ? Measuring cups. ? Measuring spoons.  Bathing  Mild baby soap and baby shampoo.  Petroleum jelly.  Soft cloth towel and washcloth.  Hooded towel.  [place]  balls.  Bath basin.  Other Supplies  Rectal thermometer.  Bulb syringe.  Baby wipes or washcloths for diaper changes.  Diaper bag.  Changing pad.  Clothing, including one-piece outfits and pajamas.  Baby nail clippers.  Receiving blankets.  Mattress pad and sheets for the crib.  Night-light for the baby's room.  Baby monitor.  2 or 3 pacifiers.  Either 24-36 cloth diapers and waterproof diaper covers or a box of disposable diapers. You may need to use as many as 10-12 diapers per day.  How do I prepare for an emergency? Prepare for an emergency by:  Knowing how to get to the nearest hospital.  Listing the phone numbers of your baby's health care providers near your home phone and in your cell phone.  How do I prepare my family?  Decide how to handle visitors.  If you have other children: ? Talk with them about the baby coming home. Ask them how they feel about it. ? Read a book together about being a new big brother or sister. ? Find ways to let them help you prepare for the new baby. ? Have someone ready to care for them while you are in the hospital. This information is not intended to replace advice given to you by your health care provider. Make sure you discuss  any questions you have with your health care provider. Document Released: 03/27/2008 Document Revised: 09/20/2015 Document Reviewed: 03/22/2014 Elsevier Interactive Patient Education  2018 [employer].  [employer] of the uterus can occur throughout pregnancy, but they are not always a sign that you are in labor. You may have practice contractions called Braxton Hicks contractions. These false labor contractions are sometimes confused with true labor. What are [place] contractions? Braxton Hicks contractions are tightening movements that occur in the muscles of the uterus before labor. Unlike true labor contractions, these contractions do not result in opening  (dilation) and thinning of the cervix. Toward the end of pregnancy (32-34 weeks), Braxton Hicks contractions can happen more often and may become stronger. These contractions are sometimes difficult to tell apart from true labor because they can be very uncomfortable. You should not feel embarrassed if you go to the hospital with false labor. Sometimes, the only way to tell if you are in true labor is for your health care provider to look for changes in the cervix. The health care provider will do a physical exam and may monitor your contractions. If you are not in true labor, the exam should show that your cervix is not dilating and your water has not broken. If there are other health problems associated with your pregnancy, it is completely safe for you to be sent home with false labor. You may continue to have Braxton Hicks contractions until you go into true labor. How to tell the difference between true labor and false labor True labor  Contractions last 30-70 seconds.  Contractions become very regular.  Discomfort is usually felt in the top of the uterus, and it spreads to the lower abdomen and low back.  Contractions do not go away with walking.  Contractions usually become more intense and increase in frequency.  The cervix dilates and gets thinner. False labor  Contractions are usually shorter and not as strong as true labor contractions.  Contractions are usually irregular.  Contractions are often felt in the front of the lower abdomen and in the groin.  Contractions may go away when you walk around or change positions while lying down.  Contractions get weaker and are shorter-lasting as time goes on.  The cervix usually does not dilate or become thin. Follow these instructions at home:  Take over-the-counter and prescription medicines only as told by your health care provider.  Keep up with your usual exercises and follow other instructions from your health care  provider.  Eat and drink lightly if you think you are going into labor.  If Braxton Hicks contractions are making you uncomfortable: ? Change your position from lying down or resting to walking, or change from walking to resting. ? Sit and rest in a tub of warm water. ? Drink enough fluid to keep your urine pale yellow. Dehydration may cause these contractions. ? Do slow and deep breathing several times an hour.  Keep all follow-up prenatal visits as told by your health care provider. This is important. Contact a health care provider if:  You have a fever.  You have continuous pain in your abdomen. Get help right away if:  Your contractions become stronger, more regular, and closer together.  You have fluid leaking or gushing from your vagina.  You pass blood-tinged mucus (bloody show).  You have bleeding from your vagina.  You have low back pain that you never had before.  You feel your baby's head pushing down  and causing pelvic pressure.  Your baby is not moving inside you as much as it used to. Summary  Contractions that occur before labor are called Braxton Hicks contractions, false labor, or practice contractions.  Braxton Hicks contractions are usually shorter, weaker, farther apart, and less regular than true labor contractions. True labor contractions usually become progressively stronger and regular and they become more frequent.  Manage discomfort from [facility] contractions by changing position, resting in a warm bath, drinking plenty of water, or practicing deep breathing. This information is not intended to replace advice given to you by your health care provider. Make sure you discuss any questions you have with your health care provider. Document Released: 08/28/2016 Document Revised: 08/28/2016 Document Reviewed: 08/28/2016 Elsevier Interactive Patient Education  2018 [place] of Pregnancy The third trimester is from week 29  through week 42, months 7 through 9. This trimester is when your unborn baby (fetus) is growing very fast. At the end of the ninth month, the unborn baby is about 20 inches in length. It weighs about 6-10 pounds. Follow these instructions at home:  Avoid all smoking, herbs, and alcohol. Avoid drugs not approved by your doctor.  Do not use any tobacco products, including cigarettes, chewing tobacco, and electronic cigarettes. If you need help quitting, ask your doctor. You may get counseling or other support to help you quit.  Only take medicine as told by your doctor. Some medicines are safe and some are not during pregnancy.  Exercise only as told by your doctor. Stop exercising if you start having cramps.  Eat regular, healthy meals.  Wear a good support bra if your breasts are tender.  Do not use hot tubs, steam rooms, or saunas.  Wear your seat belt when driving.  Avoid raw meat, uncooked cheese, and liter boxes and soil used by cats.  Take your prenatal vitamins.  Take 1500-2000 milligrams of calcium daily starting at the 20th week of pregnancy until you deliver your baby.  Try taking medicine that helps you poop (stool softener) as needed, and if your doctor approves. Eat more fiber by eating fresh fruit, vegetables, and whole grains. Drink enough fluids to keep your pee (urine) clear or pale yellow.  Take warm water baths (sitz baths) to soothe pain or discomfort caused by hemorrhoids. Use hemorrhoid cream if your doctor approves.  If you have puffy, bulging veins (varicose veins), wear support hose. Raise (elevate) your feet for 15 minutes, 3-4 times a day. Limit salt in your diet.  Avoid heavy lifting, wear low heels, and sit up straight.  Rest with your legs raised if you have leg cramps or low back pain.  Visit your dentist if you have not gone during your pregnancy. Use a soft toothbrush to brush your teeth. Be gentle when you floss.  You can have sex (intercourse)  unless your doctor tells you not to.  Do not travel far distances unless you must. Only do so with your doctor's approval.  Take prenatal classes.  Practice driving to the hospital.  Pack your hospital bag.  Prepare the baby's room.  Go to your doctor visits. Get help if:  You are not sure if you are in labor or if your water has broken.  You are dizzy.  You have mild cramps or pressure in your lower belly (abdominal).  You have a nagging pain in your belly area.  You continue to feel sick to your stomach (nauseous), throw up (vomit),  or have watery poop (diarrhea).  You have bad smelling fluid coming from your vagina.  You have pain with peeing (urination). Get help right away if:  You have a fever.  You are leaking fluid from your vagina.  You are spotting or bleeding from your vagina.  You have severe belly cramping or pain.  You lose or gain weight rapidly.  You have trouble catching your breath and have chest pain.  You notice sudden or extreme puffiness (swelling) of your face, hands, ankles, feet, or legs.  You have not felt the baby move in over an hour.  You have severe headaches that do not go away with medicine.  You have vision changes. This information is not intended to replace advice given to you by your health care provider. Make sure you discuss any questions you have with your health care provider. Document Released: 07/09/2009 Document Revised: 09/20/2015 Document Reviewed: 06/15/2012 Elsevier Interactive Patient Education  2017 [place].  Vaginal Delivery Vaginal delivery means that you will give birth by pushing your baby out of your birth canal (vagina). A team of health care providers will help you before, during, and after vaginal delivery. Birth experiences are unique for every woman and every pregnancy, and birth experiences vary depending on where you choose to give birth. What should I do to prepare for my baby's birth? Before  your baby is born, it is important to talk with your health care provider about:  Your labor and delivery preferences. These may include: ? Medicines that you may be given. ? How you will manage your pain. This might include non-medical pain relief techniques or injectable pain relief such as epidural analgesia. ? How you and your baby will be monitored during labor and delivery. ? Who may be in the labor and delivery room with you. ? Your feelings about surgical delivery of your baby (cesarean delivery, or C-section) if this becomes necessary. ? Your feelings about receiving donated blood through an IV tube (blood transfusion) if this becomes necessary.  Whether you are able: ? To take pictures or videos of the birth. ? To eat during labor and delivery. ? To move around, walk, or change positions during labor and delivery.  What to expect after your baby is born, such as: ? Whether delayed umbilical cord clamping and cutting is offered. ? Who will care for your baby right after birth. ? Medicines or tests that may be recommended for your baby. ? Whether breastfeeding is supported in your hospital or birth center. ? How long you will be in the hospital or birth center.  How any medical conditions you have may affect your baby or your labor and delivery experience.  To prepare for your baby's birth, you should also:  Attend all of your health care visits before delivery (prenatal visits) as recommended by your health care provider. This is important.  Prepare your home for your baby's arrival. Make sure that you have: ? Diapers. ? Baby clothing. ? Feeding equipment. ? Safe sleeping arrangements for you and your baby.  Install a car seat in your vehicle. Have your car seat checked by a certified car seat installer to make sure that it is installed safely.  Think about who will help you with your new baby at home for at least the first several weeks after delivery.  What can I  expect when I arrive at the birth center or hospital? Once you are in labor and have been admitted into the  hospital or birth center, your health care provider may:  Review your pregnancy history and any concerns you have.  Insert an IV tube into one of your veins. This is used to give you fluids and medicines.  Check your blood pressure, pulse, temperature, and heart rate (vital signs).  Check whether your bag of water (amniotic sac) has broken (ruptured).  Talk with you about your birth plan and discuss pain control options.  Monitoring Your health care provider may monitor your contractions (uterine monitoring) and your baby's heart rate (fetal monitoring). You may need to be monitored:  Often, but not continuously (intermittently).  All the time or for long periods at a time (continuously). Continuous monitoring may be needed if: ? You are taking certain medicines, such as medicine to relieve pain or make your contractions stronger. ? You have pregnancy or labor complications.  Monitoring may be done by:  Placing a special stethoscope or a handheld monitoring device on your abdomen to check your baby's heartbeat, and feeling your abdomen for contractions. This method of monitoring does not continuously record your baby's heartbeat or your contractions.  Placing monitors on your abdomen (external monitors) to record your baby's heartbeat and the frequency and length of contractions. You may not have to wear external monitors all the time.  Placing monitors inside of your uterus (internal monitors) to record your baby's heartbeat and the frequency, length, and strength of your contractions. ? Your health care provider may use internal monitors if he or she needs more information about the strength of your contractions or your baby's heart rate. ? Internal monitors are put in place by passing a thin, flexible wire through your vagina and into your uterus. Depending on the type of  monitor, it may remain in your uterus or on your baby's head until birth. ? Your health care provider will discuss the benefits and risks of internal monitoring with you and will ask for your permission before inserting the monitors.  Telemetry. This is a type of continuous monitoring that can be done with external or internal monitors. Instead of having to stay in bed, you are able to move around during telemetry. Ask your health care provider if telemetry is an option for you.  Physical exam Your health care provider may perform a physical exam. This may include:  Checking whether your baby is positioned: ? With the head toward your vagina (head-down). This is most common. ? With the head toward the top of your uterus (head-up or breech). If your baby is in a breech position, your health care provider may try to turn your baby to a head-down position so you can deliver vaginally. If it does not seem that your baby can be born vaginally, your provider may recommend surgery to deliver your baby. In rare cases, you may be able to deliver vaginally if your baby is head-up (breech delivery). ? Lying sideways (transverse). Babies that are lying sideways cannot be delivered vaginally.  Checking your cervix to determine: ? Whether it is thinning out (effacing). ? Whether it is opening up (dilating). ? How low your baby has moved into your birth canal.  What are the three stages of labor and delivery?  Normal labor and delivery is divided into the following three stages: Stage 1  Stage 1 is the longest stage of labor, and it can last for hours or days. Stage 1 includes: ? Early labor. This is when contractions may be irregular, or regular and mild. Generally, early  labor contractions are more than 10 minutes apart. ? Active labor. This is when contractions get longer, more regular, more frequent, and more intense. ? The transition phase. This is when contractions happen very close together, are  very intense, and may last longer than during any other part of labor.  Contractions generally feel mild, infrequent, and irregular at first. They get stronger, more frequent (about every 2-3 minutes), and more regular as you progress from early labor through active labor and transition.  Many women progress through stage 1 naturally, but you may need help to continue making progress. If this happens, your health care provider may talk with you about: ? Rupturing your amniotic sac if it has not ruptured yet. ? Giving you medicine to help make your contractions stronger and more frequent.  Stage 1 ends when your cervix is completely dilated to 4 inches (10 cm) and completely effaced. This happens at the end of the transition phase. Stage 2  Once your cervix is completely effaced and dilated to 4 inches (10 cm), you may start to feel an urge to push. It is common for the body to naturally take a rest before feeling the urge to push, especially if you received an epidural or certain other pain medicines. This rest period may last for up to 1-2 hours, depending on your unique labor experience.  During stage 2, contractions are generally less painful, because pushing helps relieve contraction pain. Instead of contraction pain, you may feel stretching and burning pain, especially when the widest part of your baby's head passes through the vaginal opening (crowning).  Your health care provider will closely monitor your pushing progress and your baby's progress through the vagina during stage 2.  Your health care provider may massage the area of skin between your vaginal opening and anus (perineum) or apply warm compresses to your perineum. This helps it stretch as the baby's head starts to crown, which can help prevent perineal tearing. ? In some cases, an incision may be made in your perineum (episiotomy) to allow the baby to pass through the vaginal opening. An episiotomy helps to make the opening of  the vagina larger to allow more room for the baby to fit through.  It is very important to breathe and focus so your health care provider can control the delivery of your baby's head. Your health care provider may have you decrease the intensity of your pushing, to help prevent perineal tearing.  After delivery of your baby's head, the shoulders and the rest of the body generally deliver very quickly and without difficulty.  Once your baby is delivered, the umbilical cord may be cut right away, or this may be delayed for 1-2 minutes, depending on your baby's health. This may vary among health care providers, hospitals, and birth centers.  If you and your baby are healthy enough, your baby may be placed on your chest or abdomen to help maintain the baby's temperature and to help you bond with each other. Some mothers and babies start breastfeeding at this time. Your health care team will dry your baby and help keep your baby warm during this time.  Your baby may need immediate care if he or she: ? Showed signs of distress during labor. ? Has a medical condition. ? Was born too early (prematurely). ? Had a bowel movement before birth (meconium). ? Shows signs of difficulty transitioning from being inside the uterus to being outside of the uterus. If you are planning to  breastfeed, your health care team will help you begin a feeding. Stage 3  The third stage of labor starts immediately after the birth of your baby and ends after you deliver the placenta. The placenta is an organ that develops during pregnancy to provide oxygen and nutrients to your baby in the womb.  Delivering the placenta may require some pushing, and you may have mild contractions. Breastfeeding can stimulate contractions to help you deliver the placenta.  After the placenta is delivered, your uterus should tighten (contract) and become firm. This helps to stop bleeding in your uterus. To help your uterus contract and to  control bleeding, your health care provider may: ? Give you medicine by injection, through an IV tube, by mouth, or through your rectum (rectally). ? Massage your abdomen or perform a vaginal exam to remove any blood clots that are left in your uterus. ? Empty your bladder by placing a thin, flexible tube (catheter) into your bladder. ? Encourage you to breastfeed your baby. After labor is over, you and your baby will be monitored closely to ensure that you are both healthy until you are ready to go home. Your health care team will teach you how to care for yourself and your baby. This information is not intended to replace advice given to you by your health care provider. Make sure you discuss any questions you have with your health care provider. Document Released: 01/22/2008 Document Revised: 11/02/2015 Document Reviewed: 04/29/2015 Elsevier Interactive Patient Education  2018 [employer].

## 2017-09-15 NOTE — Progress Notes (Signed)
   PRENATAL VISIT NOTE  Subjective:  Rachel Olsen is a 22 y.o. G3P0020 at [redacted]w[redacted]d being seen today for ongoing prenatal care.  She is currently monitored for the following issues for this low-risk pregnancy and has Supervision of normal first pregnancy, antepartum; History of herpes genitalis; and Anemia in pregnancy on their problem list.  Patient reports no complaints.  Contractions: Not present. Vag. Bleeding: None.  Movement: Present. Denies leaking of fluid.   The following portions of the patient's history were reviewed and updated as appropriate: allergies, current medications, past family history, past medical history, past social history, past surgical history and problem list. Problem list updated.  Objective:   Vitals:   09/15/17 0917  BP: 115/65  Pulse: 93  Weight: 123 lb (55.8 kg)    Fetal Status: Fetal Heart Rate (bpm): 152; doppler Fundal Height: 29 cm Movement: Present     General:  Alert, oriented and cooperative. Patient is in no acute distress.  Skin: Skin is warm and dry. No rash noted.   Cardiovascular: Normal heart rate noted  Respiratory: Normal respiratory effort, no problems with respiration noted  Abdomen: Soft, gravid, appropriate for gestational age.  Pain/Pressure: Absent     Pelvic: Cervical exam deferred        Extremities: Normal range of motion.     Mental Status: Normal mood and affect. Normal behavior. Normal judgment and thought content.   Assessment and Plan:  Pregnancy: G3P0020 at [redacted]w[redacted]d  1. Supervision of normal first pregnancy, antepartum     Doing well  2. History of herpes genitalis     Taking Valtrex for suppression currentlyl  3. Anemia during pregnancy in third trimester     Taking Bloom, Injectafer ordered  Preterm labor symptoms and general obstetric precautions including but not limited to vaginal bleeding, contractions, leaking of fluid and fetal movement were reviewed in detail with the patient. Please refer to After Visit  Summary for other counseling recommendations.  Return in about 2 weeks (around 09/29/2017) for ROB.  Future Appointments  Date Time Provider Department Center  09/29/2017  9:15 AM Roe Coombs, CNM [facility] None    Roe Coombs, CNM

## 2017-09-29 ENCOUNTER — Other Ambulatory Visit (HOSPITAL_COMMUNITY)
Admission: RE | Admit: 2017-09-29 | Discharge: 2017-09-29 | Disposition: A | Discharge status: Home / Self Care | Payer: Medicaid Other | Source: Ambulatory Visit | Attending: Certified Nurse Midwife | Admitting: Certified Nurse Midwife

## 2017-09-29 ENCOUNTER — Other Ambulatory Visit: Payer: Self-pay

## 2017-09-29 ENCOUNTER — Ambulatory Visit (INDEPENDENT_AMBULATORY_CARE_PROVIDER_SITE_OTHER): Payer: Medicaid Other | Admitting: Certified Nurse Midwife

## 2017-09-29 VITALS — BP 110/66 | HR 82 | Wt 126.9 lb

## 2017-09-29 DIAGNOSIS — N898 Other specified noninflammatory disorders of vagina: Secondary | ICD-10-CM | POA: Insufficient documentation

## 2017-09-29 DIAGNOSIS — A630 Anogenital (venereal) warts: Secondary | ICD-10-CM

## 2017-09-29 DIAGNOSIS — O98313 Other infections with a predominantly sexual mode of transmission complicating pregnancy, third trimester: Secondary | ICD-10-CM

## 2017-09-29 DIAGNOSIS — B9689 Other specified bacterial agents as the cause of diseases classified elsewhere: Secondary | ICD-10-CM | POA: Insufficient documentation

## 2017-09-29 DIAGNOSIS — O98319 Other infections with a predominantly sexual mode of transmission complicating pregnancy, unspecified trimester: Secondary | ICD-10-CM

## 2017-09-29 DIAGNOSIS — B3731 Acute candidiasis of vulva and vagina: Secondary | ICD-10-CM

## 2017-09-29 DIAGNOSIS — O99013 Anemia complicating pregnancy, third trimester: Secondary | ICD-10-CM

## 2017-09-29 DIAGNOSIS — Z3400 Encounter for supervision of normal first pregnancy, unspecified trimester: Secondary | ICD-10-CM

## 2017-09-29 DIAGNOSIS — B373 Candidiasis of vulva and vagina: Secondary | ICD-10-CM

## 2017-09-29 DIAGNOSIS — Z8619 Personal history of other infectious and parasitic diseases: Secondary | ICD-10-CM

## 2017-09-29 MED ORDER — TERCONAZOLE 0.8 % VA CREA: 1.0000 | TOPICAL_CREAM | Freq: Every day | VAGINAL | 0 refills | Status: DC

## 2017-09-29 MED ORDER — FLUCONAZOLE 150 MG PO TABS: 150.0000 mg | ORAL_TABLET | Freq: Once | ORAL | 0 refills | Status: AC

## 2017-09-29 NOTE — Progress Notes (Signed)
ROB.  Thinks she might be having an outbreak; currently taking 1 Val/day.

## 2017-09-29 NOTE — Patient Instructions (Addendum)
AREA PEDIATRIC/FAMILY PRACTICE PHYSICIANS  [facility] [address] [place], [place]  [address] Phone - [phone]   Fax - [phone]  [facility] [address] [place], [place] [address] Phone - [phone]   Fax - [phone]  JACK AMOS [address] [place], [place]  [address] Phone - [phone]   Fax - [phone]  [facility] [address] [place], [place]  [address] Phone - [phone]   Fax - [phone]  [facility] [address] [place], [place]  [address] Phone - [phone]   Fax - [phone]  [facility] [address] [place], [place]  [address] Phone - [phone]   Fax - [phone]  [facility] [address] [place], [place]  [address] Phone - [phone]   Fax - [phone]  [facility] [address] [place], [address] [place], [place]  [address] Phone - [phone]   Fax - [phone]  [facility] [address] [place], [place]  [address] Phone - [phone]   Fax - [phone] [facility] [place] [address] [place], [place]  [address] Phone - [phone]   Fax - [phone]  [facility] [address] [place], [place]  [address] Phone - [phone]   Fax - [phone]  [facility] [address], [address] [place], [place]  [address] Phone - [phone]   Fax - [phone]  [facility] [address] [place], [place]  [address] Phone - [phone]   Fax - [phone]  [facility] [address] [place], [place]  [address] Phone - [phone]  [facility] [address] [place], [place]  [address] Phone - [phone]   Fax - [phone]  [facility] [address] [place], [place]  [address] Phone - [phone]   Fax - [phone]  [facility] [address] [place], [place]  [address] Phone - [phone]   Fax - [phone]  [facility] [address] [place], [place]  [address] Phone - [phone]   Fax - [phone]   [facility] [address] [place] [address] [place], [place]  [address] Phone - [phone]   Fax - [phone]  [facility] [address] [place], [place]  [address] Phone - [phone]   Fax - [phone]  DAVID RUBIN [address] [place], [place]  [address] Phone - [phone]   Fax - [phone]  [facility] [address] [place], [place]  [address] Phone - [phone]   Fax - [phone]  [facility] Alita Chyle [address] [place], [place]  [address] Phone - [phone]   Fax - [phone] Gerarda Fraction [address] [place], [place]  [address] Phone - [phone]   Fax - [phone]  [facility] [address] [place], [place]  [address] Phone - [phone]   Fax - [phone]  [facility] [place] [address] [place], [place]  [address] Phone - [phone]   Fax - [phone]  [facility] [place] Wyvonne Lenz MD [address] [place] [address] Phone [phone]  Fax [phone]   Third Trimester of Pregnancy The third trimester is from week 28 through week 40 (  months 7 through 9). The third trimester is a time when the unborn baby (fetus) is growing rapidly. At the end of the ninth month, the fetus is about 20 inches in length and weighs 6-10 pounds. Body changes during your third trimester Your body will continue to go through many changes during pregnancy. The changes vary from woman to woman. During the third trimester:  Your weight will continue to increase.  You can expect to gain 25-35 pounds (11-16 kg) by the end of the pregnancy.  You may begin to get stretch marks on your hips, abdomen, and breasts.  You may urinate more often because the fetus is moving lower into your pelvis and pressing on your bladder.  You may develop or continue to have heartburn. This is caused by increased hormones that slow down muscles in the digestive tract.  You may develop or continue to have constipation because increased hormones slow digestion and cause the muscles that push waste through your intestines to relax.  You may develop hemorrhoids. These are swollen veins (varicose veins) in the rectum that can itch or be painful.  You may develop swollen, bulging veins (varicose veins) in your legs.  You may have increased body aches in the pelvis, back, or thighs. This is due to weight gain and increased hormones that are relaxing your joints.  You may have changes in your hair. These can include thickening of your hair, rapid growth, and changes in texture. Some women also have hair loss during or after pregnancy, or hair that feels dry or thin. Your hair will most likely return to normal after your baby is born.  Your breasts will continue to grow and they will continue to become tender. A yellow fluid (colostrum) may leak from your breasts. This is the first milk you are producing for your baby.  Your belly button may stick out.  You may notice more swelling in your hands, face, or ankles.  You may have increased tingling or numbness in your hands, arms, and legs. The skin on your belly may also feel numb.  You may feel short of breath because of your expanding uterus.  You may have more problems sleeping. This can be caused by the size of your belly, increased need to urinate, and an increase in your body's metabolism.  You may notice the fetus "dropping," or moving lower in your abdomen (lightening).  You may have increased vaginal discharge.  You  may notice your joints feel loose and you may have pain around your pelvic bone.  What to expect at prenatal visits You will have prenatal exams every 2 weeks until week 36. Then you will have weekly prenatal exams. During a routine prenatal visit:  You will be weighed to make sure you and the baby are growing normally.  Your blood pressure will be taken.  Your abdomen will be measured to track your baby's growth.  The fetal heartbeat will be listened to.  Any test results from the previous visit will be discussed.  You may have a cervical check near your due date to see if your cervix has softened or thinned (effaced).  You will be tested for Group B streptococcus. This happens between 35 and 37 weeks.  Your health care provider may ask you:  What your birth plan is.  How you are feeling.  If you are feeling the baby move.  If you have had any abnormal symptoms, such as leaking fluid, bleeding, severe headaches, or abdominal cramping.  If you are using any tobacco products, including cigarettes, chewing tobacco, and electronic cigarettes.  If you have any questions.  Other tests or screenings that may be performed during your third trimester include:  Blood tests that check for low iron levels (anemia).  Fetal testing to check the health, activity level, and growth of the fetus. Testing is done if you have certain medical conditions or if there are problems during the pregnancy.  Nonstress test (NST). This test checks the health of your baby to make sure there are no signs of problems, such as the baby not getting enough oxygen. During this test, a belt is placed around your belly. The baby is made to move, and its heart rate is monitored during movement.  What is false labor? False labor is a condition in which you feel small, irregular tightenings of the muscles in the womb (contractions) that usually go away with rest, changing position, or drinking water. These are  called Braxton Hicks contractions. Contractions may last for hours, days, or even weeks before true labor sets in. If contractions come at regular intervals, become more frequent, increase in intensity, or become painful, you should see your health care provider. What are the signs of labor?  Abdominal cramps.  Regular contractions that start at 10 minutes apart and become stronger and more frequent with time.  Contractions that start on the top of the uterus and spread down to the lower abdomen and back.  Increased pelvic pressure and dull back pain.  A watery or bloody mucus discharge that comes from the vagina.  Leaking of amniotic fluid. This is also known as your "water breaking." It could be a slow trickle or a gush. Let your health care provider know if it has a color or strange odor. If you have any of these signs, call your health care provider right away, even if it is before your due date. Follow these instructions at home: Medicines  Follow your health care provider's instructions regarding medicine use. Specific medicines may be either safe or unsafe to take during pregnancy.  Take a prenatal vitamin that contains at least 600 micrograms (mcg) of folic acid.  If you develop constipation, try taking a stool softener if your health care provider approves. Eating and drinking  Eat a balanced diet that includes fresh fruits and vegetables, whole grains, good sources of protein such as meat, eggs, or tofu, and low-fat dairy. Your health care provider will help you determine the amount of weight gain that is right for you.  Avoid raw meat and uncooked cheese. These carry germs that can cause birth defects in the baby.  If you have low calcium intake from food, talk to your health care provider about whether you should take a daily calcium supplement.  Eat four or five small meals rather than three large meals a day.  Limit foods that are high in fat and processed sugars, such  as fried and sweet foods.  To prevent constipation: ? Drink enough fluid to keep your urine clear or pale yellow. ? Eat foods that are high in fiber, such as fresh fruits and vegetables, whole grains, and beans. Activity  Exercise only as directed by your health care provider. Most women can continue their usual exercise routine during pregnancy. Try to exercise for 30 minutes at least 5 days a week. Stop exercising if you experience uterine contractions.  Avoid heavy lifting.  Do not exercise in extreme heat or humidity, or at high altitudes.  Wear low-heel, comfortable shoes.  Practice good posture.  You may continue to have sex unless your health care provider tells you otherwise. Relieving pain and discomfort  Take frequent breaks and rest with your legs elevated if you have leg cramps or low back pain.  Take warm sitz baths to soothe any pain or discomfort caused by hemorrhoids. Use hemorrhoid cream if your health care provider approves.  Wear a good support bra to prevent discomfort from breast tenderness.  If you develop varicose veins: ? Wear support pantyhose or compression stockings as told by your healthcare provider. ? Elevate your feet for 15 minutes, 3-4 times a day. Prenatal care  Write down your questions. Take them to your prenatal visits.  Keep all your prenatal visits as told by your health care provider. This is important. Safety  Wear your seat belt at all times when driving.  Make a list of emergency phone numbers, including numbers for family, friends, the hospital, and police and fire departments. General instructions  Avoid cat litter boxes and soil used by cats. These carry germs that can cause birth defects in the baby. If you have a cat, ask someone to clean the litter box for you.  Do not travel far distances unless it is absolutely necessary and only with the approval of your health care provider.  Do not use hot tubs, steam rooms, or  saunas.  Do not drink alcohol.  Do not use any products that contain nicotine or tobacco, such as cigarettes and e-cigarettes. If you need help quitting, ask your health care provider.  Do not use any medicinal herbs or unprescribed drugs. These chemicals affect the formation and growth of the baby.  Do not douche or use tampons or scented sanitary pads.  Do not cross your legs for long periods of time.  To prepare for the arrival of your baby: ? Take prenatal classes to understand, practice, and ask questions about labor and delivery. ? Make a trial run to the hospital. ? Visit the hospital and tour the maternity area. ? Arrange for maternity or paternity leave through employers. ? Arrange for family and friends to take care of pets while you are in the hospital. ? Purchase a rear-facing car seat and make sure you know how to install it in your car. ? Pack your hospital bag. ? Prepare the baby's nursery. Make sure to remove all pillows and stuffed animals from the baby's crib to prevent suffocation.  Visit your dentist if you have not gone during your pregnancy. Use a soft toothbrush to brush your teeth and be gentle when you floss. Contact a health care provider if:  You are unsure if you are in labor or if your water has broken.  You become dizzy.  You have mild pelvic cramps, pelvic pressure, or nagging pain in your abdominal area.  You have lower back pain.  You have persistent nausea, vomiting, or diarrhea.  You have an unusual or bad smelling vaginal discharge.  You have pain when you urinate. Get help right away if:  Your water breaks before 37 weeks.  You have regular contractions less than 5 minutes apart before 37 weeks.  You have a fever.  You are leaking fluid from your vagina.  You have spotting or bleeding from your vagina.  You have severe abdominal pain or cramping.  You have rapid weight loss or weight gain.  You have shortness of breath with  chest pain.  You notice sudden or extreme  swelling of your face, hands, ankles, feet, or legs.  Your baby makes fewer than 10 movements in 2 hours.  You have severe headaches that do not go away when you take medicine.  You have vision changes. Summary  The third trimester is from week 28 through week 40, months 7 through 9. The third trimester is a time when the unborn baby (fetus) is growing rapidly.  During the third trimester, your discomfort may increase as you and your baby continue to gain weight. You may have abdominal, leg, and back pain, sleeping problems, and an increased need to urinate.  During the third trimester your breasts will keep growing and they will continue to become tender. A yellow fluid (colostrum) may leak from your breasts. This is the first milk you are producing for your baby.  False labor is a condition in which you feel small, irregular tightenings of the muscles in the womb (contractions) that eventually go away. These are called Braxton Hicks contractions. Contractions may last for hours, days, or even weeks before true labor sets in.  Signs of labor can include: abdominal cramps; regular contractions that start at 10 minutes apart and become stronger and more frequent with time; watery or bloody mucus discharge that comes from the vagina; increased pelvic pressure and dull back pain; and leaking of amniotic fluid. This information is not intended to replace advice given to you by your health care provider. Make sure you discuss any questions you have with your health care provider. Document Released: 04/08/2001 Document Revised: 09/20/2015 Document Reviewed: 06/15/2012 Elsevier Interactive Patient Education  2017 [employer] Inc[employer]  Genital Warts Genital warts are a common STD (sexually transmitted disease). They may appear as small bumps on the tissues of the genital area or anal area. Sometimes, they can become irritated and cause pain. Genital warts are  easily passed to other people through sexual contact. Getting treatment is important because genital warts can lead to other problems. In females, the virus that causes genital warts may increase the risk of cervical cancer. What are the causes? Genital warts are caused by a virus that is called human papillomavirus (HPV). HPV is spread by having unprotected sex with an infected person. It can be spread through vaginal, anal, and oral sex. Many people do not know that they are infected. They may be infected for years without problems. However, even if they do not have problems, they can pass the infection to their sexual partners. What increases the risk? Genital warts are more likely to develop in:  People who have unprotected sex.  People who have multiple sexual partners.  People who become sexually active before they are 21 years of age.  Men who are not circumcised.  Women who have a female sexual partner who is not circumcised.  People who have a weakened body defense system (immune system) due to disease or medicine.  People who smoke.  What are the signs or symptoms? Symptoms of genital warts include:  Small growths in the genital area or anal area. These warts often grow in clusters.  Itching and irritation in the genital area or anal area.  Bleeding from the warts.  Painful sexual intercourse.  How is this diagnosed? Genital warts can usually be diagnosed from their appearance on the vagina, vulva, penis, perineum, anus, or rectum. Tests may also be done, such as:  Biopsy. A tissue sample is removed so it can be looked at under a microscope.  Colposcopy. In females, a magnifying tool  is used to examine the vagina and cervix. Certain solutions may be used to make the HPV cells change color so they can be seen more easily.  A Pap test in females.  Tests for other STDs.  How is this treated? Treatment for genital warts may include:  Applying prescription medicines to  the warts. These may be solutions or creams.  Freezing the warts with liquid nitrogen (cryotherapy).  Burning the warts with: ? Laser treatment. ? An electrified probe (electrocautery).  Injecting a substance (Candida antigen or Trichophyton antigen) into the warts to help the body's immune system to fight off the warts.  Interferon injections.  Surgery to remove the warts.  Follow these instructions at home: Medicines  Apply over-the-counter and prescription medicines only as told by your health care provider.  Do not treat genital warts with medicines that are used for treating hand warts.  Talk with your health care provider about using over-the-counter anti-itch creams. General instructions  Do not touch or scratch the warts.  Do not have sex until your treatment has been completed.  Tell your current and past sexual partners about your condition because they may also need treatment.  Keep all follow-up visits as told by your health care provider. This is important.  After treatment, use condoms during sex to prevent future infections. Other Instructions for Women  Women who have genital warts might need increased screening for cervical cancer. This type of cancer is slow growing and can be cured if it is found early. Chances of developing cervical cancer are increased with HPV.  If you become pregnant, tell your health care provider that you have had HPV. Your health care provider will monitor you closely during pregnancy to be sure that your baby is safe. How is this prevented? Talk with your health care provider about getting the HPV vaccines. These vaccines prevent some HPV infections and cancers. It is recommended that the vaccine be given to males and females who are 42-70 years of age. It will not work if you already have HPV, and it is not recommended for pregnant women. Contact a health care provider if:  You have redness, swelling, or pain in the area of the  treated skin.  You have a fever.  You feel generally ill.  You feel lumps in and around your genital area or anal area.  You have bleeding in your genital area or anal area.  You have pain during sexual intercourse. This information is not intended to replace advice given to you by your health care provider. Make sure you discuss any questions you have with your health care provider. Document Released: 04/11/2000 Document Revised: 09/20/2015 Document Reviewed: 07/10/2014 Elsevier Interactive Patient Education  [employer].

## 2017-09-29 NOTE — Progress Notes (Signed)
   PRENATAL VISIT NOTE  Subjective:  Rachel Olsen is a 22 y.o. G3P0020 at 4455w1d being seen today for ongoing prenatal care.  She is currently monitored for the following issues for this low-risk pregnancy and has Supervision of normal first pregnancy, antepartum; History of herpes genitalis; Anemia in pregnancy; and Genital warts complicating pregnancy on their problem list.  Patient reports no bleeding, no contractions, no cramping, no leaking and vaginal irritation.  Contractions: Not present. Vag. Bleeding: None.  Movement: Present. Denies leaking of fluid.   The following portions of the patient's history were reviewed and updated as appropriate: allergies, current medications, past family history, past medical history, past social history, past surgical history and problem list. Problem list updated.  Objective:   Vitals:   09/29/17 0909  BP: 110/66  Pulse: 82  Weight: 126 lb 14.4 oz (57.6 kg)    Fetal Status: Fetal Heart Rate (bpm): 155; doppler Fundal Height: 31 cm Movement: Present     General:  Alert, oriented and cooperative. Patient is in no acute distress.  Skin: Skin is warm and dry. No rash noted.   Cardiovascular: Normal heart rate noted  Respiratory: Normal respiratory effort, no problems with respiration noted  Abdomen: Soft, gravid, appropriate for gestational age.  Pain/Pressure: Present     Pelvic: Cervical exam deferred        Extremities: Normal range of motion.  Edema: Trace  Mental Status: Normal mood and affect. Normal behavior. Normal judgment and thought content.  Patient identified, informed consent signed and copy in chart, time out performed.    Areas of typical appearing genital warts noted posterior aspect of introitus.  Surrounding area coated with water based lubricant and TCA applied until warts had white appearance.   Patient tolerated the procedure well.    Post procedure instructions given and patient told to wash area in thirty  minutes.  Return in 2 weeks for next treatment if required.    Assessment and Plan:  Pregnancy: G3P0020 at 3055w1d  1. Supervision of normal first pregnancy, antepartum      New onset of genital warts  2. History of herpes genitalis     Taking valtex daily  3. Anemia during pregnancy in third trimester     F/U CBC today - CBC  4. 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    5. Yeast vaginitis    - fluconazole (DIFLUCAN) 150 MG tablet; Take 1 tablet (150 mg total) by mouth once for 1 dose.  Dispense: 1 tablet; Refill: 0 - terconazole (TERAZOL 3) 0.8 % vaginal cream; Place 1 applicator vaginally at bedtime.  Dispense: 20 g; Refill: 0  Preterm labor symptoms and general obstetric precautions including but not limited to vaginal bleeding, contractions, leaking of fluid and fetal movement were reviewed in detail with the patient. Please refer to After Visit Summary for other counseling recommendations.  Return in about 2 weeks (around 10/13/2017) for ROB ?TCA.  No future appointments.  Roe Coombsachelle A Shakeem Stern, CNM

## 2017-09-30 LAB — CERVICOVAGINAL ANCILLARY ONLY
Bacterial vaginitis: POSITIVE — AB
CHLAMYDIA, DNA PROBE: NEGATIVE
Candida vaginitis: POSITIVE — AB
Neisseria Gonorrhea: NEGATIVE
Trichomonas: NEGATIVE

## 2017-09-30 LAB — CBC
Hematocrit: 29.5 % — ABNORMAL LOW (ref 34.0–46.6)
Hemoglobin: 9.4 g/dL — ABNORMAL LOW (ref 11.1–15.9)
MCH: 27.8 pg (ref 26.6–33.0)
MCHC: 31.9 g/dL (ref 31.5–35.7)
MCV: 87 fL (ref 79–97)
PLATELETS: 330 10*3/uL (ref 150–450)
RBC: 3.38 x10E6/uL — ABNORMAL LOW (ref 3.77–5.28)
RDW: 17.4 % — AB (ref 12.3–15.4)
WBC: 7.9 10*3/uL (ref 3.4–10.8)

## 2017-10-07 ENCOUNTER — Telehealth: Payer: Self-pay

## 2017-10-07 NOTE — Telephone Encounter (Signed)
error 

## 2017-10-13 ENCOUNTER — Ambulatory Visit (INDEPENDENT_AMBULATORY_CARE_PROVIDER_SITE_OTHER): Payer: Medicaid Other | Admitting: Certified Nurse Midwife

## 2017-10-13 ENCOUNTER — Encounter: Payer: Self-pay | Admitting: Certified Nurse Midwife

## 2017-10-13 VITALS — BP 112/71 | HR 92 | Wt 131.0 lb

## 2017-10-13 DIAGNOSIS — Z8619 Personal history of other infectious and parasitic diseases: Secondary | ICD-10-CM

## 2017-10-13 DIAGNOSIS — Z3403 Encounter for supervision of normal first pregnancy, third trimester: Secondary | ICD-10-CM

## 2017-10-13 DIAGNOSIS — A630 Anogenital (venereal) warts: Secondary | ICD-10-CM

## 2017-10-13 DIAGNOSIS — Z3400 Encounter for supervision of normal first pregnancy, unspecified trimester: Secondary | ICD-10-CM

## 2017-10-13 DIAGNOSIS — O98313 Other infections with a predominantly sexual mode of transmission complicating pregnancy, third trimester: Secondary | ICD-10-CM

## 2017-10-13 DIAGNOSIS — O99013 Anemia complicating pregnancy, third trimester: Secondary | ICD-10-CM

## 2017-10-13 NOTE — Progress Notes (Signed)
   PRENATAL VISIT NOTE  Subjective:  Rachel Olsen is a 22 y.o. G3P0020 at 8177w1d being seen today for ongoing prenatal care.  She is currently monitored for the following issues for this low-risk pregnancy and has Supervision of normal first pregnancy, antepartum; History of herpes genitalis; Anemia in pregnancy; and Genital warts complicating pregnancy on their problem list.  Patient reports no bleeding, no contractions, no cramping, no leaking and genital warts desires recheck and treatment.  Contractions: Not present. Vag. Bleeding: None.  Movement: Present. Denies leaking of fluid.   The following portions of the patient's history were reviewed and updated as appropriate: allergies, current medications, past family history, past medical history, past social history, past surgical history and problem list. Problem list updated.  Objective:   Vitals:   10/13/17 0929  BP: 112/71  Pulse: 92  Weight: 131 lb (59.4 kg)    Fetal Status: Fetal Heart Rate (bpm): 148; doppler Fundal Height: 34 cm Movement: Present     General:  Alert, oriented and cooperative. Patient is in no acute distress.  Skin: Skin is warm and dry. No rash noted.   Cardiovascular: Normal heart rate noted  Respiratory: Normal respiratory effort, no problems with respiration noted  Abdomen: Soft, gravid, appropriate for gestational age.  Pain/Pressure: Present     Pelvic: Cervical exam deferred        Extremities: Normal range of motion.     Mental Status: Normal mood and affect. Normal behavior. Normal judgment and thought content.  Patient identified, informed consent signed and copy in chart, time out performed.    Areas of typical appearing genital warts noted posterior labia around introitus.  Surrounding area coated with water based lubricant and TCA applied until warts had white appearance.   Patient tolerated the procedure well.    Post procedure instructions given and patient told to wash area in thirty  minutes.  Return in 2 weeks for next treatment.    Assessment and Plan:  Pregnancy: G3P0020 at 6677w1d  1. Supervision of normal first pregnancy, antepartum       2. History of herpes genitalis     Valtrex @36  wks  3. Anemia during pregnancy in third trimester     Taking iron.  Iron transfusions scheduled.  4. 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 Please refer to After Visit Summary for other counseling recommendations.  Return in about 2 weeks (around 10/27/2017) for ROB, GBS.?TCA  No future appointments.  Roe Coombsachelle A Avanelle Pixley, CNM

## 2017-10-16 ENCOUNTER — Other Ambulatory Visit (HOSPITAL_COMMUNITY): Payer: Self-pay | Admitting: *Deleted

## 2017-10-19 ENCOUNTER — Ambulatory Visit (HOSPITAL_COMMUNITY)
Admission: RE | Admit: 2017-10-19 | Discharge: 2017-10-19 | Disposition: A | Discharge status: Home / Self Care | Payer: Medicaid Other | Source: Ambulatory Visit | Attending: Obstetrics | Admitting: Obstetrics

## 2017-10-19 DIAGNOSIS — Z3A00 Weeks of gestation of pregnancy not specified: Secondary | ICD-10-CM | POA: Insufficient documentation

## 2017-10-19 DIAGNOSIS — O99019 Anemia complicating pregnancy, unspecified trimester: Secondary | ICD-10-CM | POA: Diagnosis present

## 2017-10-19 MED ORDER — SODIUM CHLORIDE 0.9 % IV SOLN
510.0000 mg | INTRAVENOUS | Status: DC
  Administered 2017-10-19: 510 mg via INTRAVENOUS
  Filled 2017-10-19: qty 17

## 2017-10-19 NOTE — Discharge Instructions (Signed)

## 2017-10-26 ENCOUNTER — Ambulatory Visit (HOSPITAL_COMMUNITY)
Admission: RE | Admit: 2017-10-26 | Discharge: 2017-10-26 | Disposition: A | Discharge status: Home / Self Care | Payer: Medicaid Other | Source: Ambulatory Visit | Attending: Obstetrics | Admitting: Obstetrics

## 2017-10-26 DIAGNOSIS — O99019 Anemia complicating pregnancy, unspecified trimester: Secondary | ICD-10-CM | POA: Insufficient documentation

## 2017-10-26 DIAGNOSIS — Z3A00 Weeks of gestation of pregnancy not specified: Secondary | ICD-10-CM | POA: Insufficient documentation

## 2017-10-26 MED ORDER — SODIUM CHLORIDE 0.9 % IV SOLN
510.0000 mg | INTRAVENOUS | Status: DC
  Administered 2017-10-26: 510 mg via INTRAVENOUS
  Filled 2017-10-26: qty 17

## 2017-10-27 ENCOUNTER — Ambulatory Visit (INDEPENDENT_AMBULATORY_CARE_PROVIDER_SITE_OTHER): Payer: Medicaid Other | Admitting: Certified Nurse Midwife

## 2017-10-27 ENCOUNTER — Encounter: Payer: Self-pay | Admitting: Certified Nurse Midwife

## 2017-10-27 VITALS — BP 106/69 | HR 90 | Wt 130.0 lb

## 2017-10-27 DIAGNOSIS — A630 Anogenital (venereal) warts: Secondary | ICD-10-CM

## 2017-10-27 DIAGNOSIS — Z8619 Personal history of other infectious and parasitic diseases: Secondary | ICD-10-CM

## 2017-10-27 DIAGNOSIS — O98313 Other infections with a predominantly sexual mode of transmission complicating pregnancy, third trimester: Secondary | ICD-10-CM

## 2017-10-27 DIAGNOSIS — Z3400 Encounter for supervision of normal first pregnancy, unspecified trimester: Secondary | ICD-10-CM

## 2017-10-27 DIAGNOSIS — O99013 Anemia complicating pregnancy, third trimester: Secondary | ICD-10-CM

## 2017-10-27 MED ORDER — IMIQUIMOD 5 % EX CREA: TOPICAL_CREAM | CUTANEOUS | 0 refills | Status: AC

## 2017-10-27 NOTE — Progress Notes (Signed)
   PRENATAL VISIT NOTE  Subjective:  Rachel DutyLizbeth Treanor is a 22 y.o. G3P0020 at 2095w1d being seen today for ongoing prenatal care.  She is currently monitored for the following issues for this low-risk pregnancy and has Supervision of normal first pregnancy, antepartum; History of herpes genitalis; Anemia in pregnancy; and Genital warts complicating pregnancy on their problem list.  Patient reports genital warts have not improved with TCA treatments and desires a cream.  Contractions: Not present. Vag. Bleeding: None.  Movement: Present. Denies leaking of fluid.   The following portions of the patient's history were reviewed and updated as appropriate: allergies, current medications, past family history, past medical history, past social history, past surgical history and problem list. Problem list updated.  Objective:   Vitals:   10/27/17 1117  BP: 106/69  Pulse: 90  Weight: 130 lb (59 kg)    Fetal Status: Fetal Heart Rate (bpm): 156; doppler Fundal Height: 35 cm Movement: Present     General:  Alert, oriented and cooperative. Patient is in no acute distress.  Skin: Skin is warm and dry. No rash noted.   Cardiovascular: Normal heart rate noted  Respiratory: Normal respiratory effort, no problems with respiration noted  Abdomen: Soft, gravid, appropriate for gestational age.  Pain/Pressure: Present     Pelvic: Cervical exam deferred        Extremities: Normal range of motion.  Edema: Trace  Mental Status: Normal mood and affect. Normal behavior. Normal judgment and thought content.   Assessment and Plan:  Pregnancy: G3P0020 at 6095w1d  1. Supervision of normal first pregnancy, antepartum      Doing well overall.    2. History of herpes genitalis     Taking Valtrex daily  3. Anemia during pregnancy in third trimester     Taking iron  4. Genital warts complicating pregnancy, third trimester     Previous TCA treatment X2.  - imiquimod (ALDARA) 5 % cream; Apply topically 3 (three)  times a week.  Dispense: 12 each; Refill: 0  Preterm labor symptoms and general obstetric precautions including but not limited to vaginal bleeding, contractions, leaking of fluid and fetal movement were reviewed in detail with the patient. Please refer to After Visit Summary for other counseling recommendations.  Return in about 1 week (around 11/03/2017) for ROB, GBS.  Future Appointments  Date Time Provider Department Center  11/04/2017  8:30 AM Roe Coombsenney, Rachelle A, CNM [facility] None    Roe Coombsachelle A Denney, CNM

## 2017-11-04 ENCOUNTER — Encounter: Payer: Self-pay | Admitting: Certified Nurse Midwife

## 2017-11-04 ENCOUNTER — Other Ambulatory Visit (HOSPITAL_COMMUNITY)
Admission: RE | Admit: 2017-11-04 | Discharge: 2017-11-04 | Disposition: A | Discharge status: Home / Self Care | Payer: Medicaid Other | Source: Ambulatory Visit | Attending: Certified Nurse Midwife | Admitting: Certified Nurse Midwife

## 2017-11-04 ENCOUNTER — Ambulatory Visit (INDEPENDENT_AMBULATORY_CARE_PROVIDER_SITE_OTHER): Payer: Medicaid Other | Admitting: Certified Nurse Midwife

## 2017-11-04 VITALS — BP 128/78 | HR 80 | Wt 135.2 lb

## 2017-11-04 DIAGNOSIS — A630 Anogenital (venereal) warts: Secondary | ICD-10-CM

## 2017-11-04 DIAGNOSIS — Z3400 Encounter for supervision of normal first pregnancy, unspecified trimester: Secondary | ICD-10-CM | POA: Diagnosis not present

## 2017-11-04 DIAGNOSIS — Z3A00 Weeks of gestation of pregnancy not specified: Secondary | ICD-10-CM | POA: Insufficient documentation

## 2017-11-04 DIAGNOSIS — O99013 Anemia complicating pregnancy, third trimester: Secondary | ICD-10-CM

## 2017-11-04 DIAGNOSIS — Z8619 Personal history of other infectious and parasitic diseases: Secondary | ICD-10-CM

## 2017-11-04 DIAGNOSIS — O98313 Other infections with a predominantly sexual mode of transmission complicating pregnancy, third trimester: Secondary | ICD-10-CM

## 2017-11-04 NOTE — Progress Notes (Signed)
Patient reports good fetal movement with some mild pressure and irregular contractions.

## 2017-11-04 NOTE — Progress Notes (Signed)
   PRENATAL VISIT NOTE  Subjective:  Rachel Olsen is a 22 y.o. G3P0020 at 2221w2d being seen today for ongoing prenatal care.  She is currently monitored for the following issues for this low-risk pregnancy and has Supervision of normal first pregnancy, antepartum; History of herpes genitalis; Anemia in pregnancy; and Genital warts complicating pregnancy on their problem list.  Patient reports no bleeding, no contractions, no cramping, no leaking, vaginal irritation and states that the Aldera cream has not helped with her genital warts, desires retreatment with TCA.  Contractions: Irregular. Vag. Bleeding: None.  Movement: Present. Denies leaking of fluid.   The following portions of the patient's history were reviewed and updated as appropriate: allergies, current medications, past family history, past medical history, past social history, past surgical history and problem list. Problem list updated.  Objective:   Vitals:   11/04/17 0828  BP: 128/78  Pulse: 80  Weight: 135 lb 3.2 oz (61.3 kg)    Fetal Status: Fetal Heart Rate (bpm): 150; doppler Fundal Height: 36 cm Movement: Present  Presentation: Vertex  General:  Alert, oriented and cooperative. Patient is in no acute distress.  Skin: Skin is warm and dry. No rash noted.   Cardiovascular: Normal heart rate noted  Respiratory: Normal respiratory effort, no problems with respiration noted  Abdomen: Soft, gravid, appropriate for gestational age.  Pain/Pressure: Present     Pelvic: Cervical exam performed Dilation: Closed Effacement (%): 20 Station: -3  Extremities: Normal range of motion.  Edema: Trace  Mental Status: Normal mood and affect. Normal behavior. Normal judgment and thought content.  Patient identified, informed consent signed and copy in chart, time out performed.    Areas of typical appearing genital warts noted posterior vaginal vault some other areas around the upper right/left labia.  Surrounding area coated with  water based lubricant and TCA applied until warts had white appearance.   Patient tolerated the procedure well.    Post procedure instructions given and patient told to wash area in thirty minutes.  Return in 1 week for next treatment.    Assessment and Plan:  Pregnancy: G3P0020 at 8121w2d  1. Supervision of normal first pregnancy, antepartum      - Strep Gp B NAA - Cervicovaginal ancillary only  2. History of herpes genitalis    Taking valtrex  3. Anemia during pregnancy in third trimester     Taking iron  4. 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 Please refer to After Visit Summary for other counseling recommendations.  Return in about 1 week (around 11/11/2017) for ROB.  Future Appointments  Date Time Provider Department Center  11/11/2017  8:50 AM Burleson, Brand Maleserri L, NP [facility] None    Roe Coombsachelle A Darlene Bartelt, CNM

## 2017-11-05 LAB — CERVICOVAGINAL ANCILLARY ONLY
CHLAMYDIA, DNA PROBE: NEGATIVE
Neisseria Gonorrhea: NEGATIVE

## 2017-11-06 LAB — STREP GP B NAA: Strep Gp B NAA: NEGATIVE

## 2017-11-11 ENCOUNTER — Ambulatory Visit (INDEPENDENT_AMBULATORY_CARE_PROVIDER_SITE_OTHER): Payer: Medicaid Other | Admitting: Nurse Practitioner

## 2017-11-11 VITALS — BP 116/68 | HR 90 | Wt 136.4 lb

## 2017-11-11 DIAGNOSIS — Z8619 Personal history of other infectious and parasitic diseases: Secondary | ICD-10-CM

## 2017-11-11 DIAGNOSIS — O99019 Anemia complicating pregnancy, unspecified trimester: Secondary | ICD-10-CM

## 2017-11-11 DIAGNOSIS — Z3400 Encounter for supervision of normal first pregnancy, unspecified trimester: Secondary | ICD-10-CM

## 2017-11-11 DIAGNOSIS — O98319 Other infections with a predominantly sexual mode of transmission complicating pregnancy, unspecified trimester: Secondary | ICD-10-CM

## 2017-11-11 DIAGNOSIS — A630 Anogenital (venereal) warts: Secondary | ICD-10-CM

## 2017-11-11 NOTE — Patient Instructions (Addendum)
BENEFITS OF BREASTFEEDING Many women wonder if they should breastfeed. Research shows that breast milk contains the perfect balance of vitamins, protein and fat that your baby needs to grow. It also contains antibodies that help your baby's immune system to fight off viruses and bacteria and can reduce the risk of sudden infant death syndrome (SIDS). In addition, the colostrum (a fluid secreted from the breast in the first few days after delivery) helps your newborn's digestive system to grow and function well. Breast milk is easier to digest than formula. Also, if your baby is born preterm, breast milk can help to reduce both short- and long-term health problems. BENEFITS OF BREASTFEEDING FOR MOM . Breastfeeding causes a hormone to be released that helps the uterus to contract and return to its normal size more quickly. . It aids in postpartum weight loss, reduces risk of breast and ovarian cancer, heart disease and rheumatoid arthritis. . It decreases the amount of bleeding after the baby is born. benefits of breastfeeding for baby . Provides comfort and nutrition . Protects baby against - Obesity - Diabetes - Asthma - Childhood cancers - Heart disease - Ear infections - Diarrhea - Pneumonia - Stomach problems - Serious allergies - Skin rashes . Promotes growth and development . Reduces the risk of baby having Sudden Infant Death Syndrome (SIDS) only breastmilk for the first 6 months . Protects baby against diseases/allergies . It's the perfect amount for tiny bellies . It restores baby's energy . Provides the best nutrition for baby . Giving water or formula can make baby more likely to get sick, decrease Mom's milk supply, make baby less content with breastfeeding Skin to Skin After delivery, the staff will place your baby on your chest. This helps with the following: . Regulates baby's temperature, breathing, heart rate and blood sugar . Increases Mom's milk supply . Promotes  bonding . Keeps baby and Mom calm and decreases baby's crying Rooming In Your baby will stay in your room with you for the entire time you are in the hospital. This helps with the following: . Allows Mom to learn baby's feeding cues - Fluttering eyes - Sucking on tongue or hand - Rooting (opens mouth and turns head) - Nuzzling into the breast - Bringing hand to mouth . Allows breastfeeding on demand (when your baby is ready) . Helps baby to be calm and content . Ensures a good milk supply . Prevents complications with breastfeeding . Allows parents to learn to care for baby . Allows you to request assistance with breastfeeding Importance of a good latch . Increases milk transfer to baby - baby gets enough milk . Ensures you have enough milk for your baby . Decreases nipple soreness . Don't use pacifiers and bottles - these cause baby to suck differently than breastfeeding . Promotes continuation of breastfeeding Risks of Formula Supplementation with Breastfeeding Giving your infant formula in addition to your breast-milk EXCEPT when medically necessary can lead to: . Decreases your milk supply  . Loss of confidence in yourself for providing baby's nutrition  . Engorgement and possibly mastitis  . Asthma & allergies in the baby BREASTFEEDING FAQS How long should I breastfeed my baby? It is recommended that you provide your baby with breast milk only for the first 6 months and then continue for the first year and longer as desired. During the first few weeks after birth, your baby will need to feed 8-12 times every 24 hours, or every 2-3 hours. They will likely feed   for 15-30 minutes. How can I help my baby begin breastfeeding? Babies are born with an instinct to breastfeed. A healthy baby can begin breastfeeding right away without specific help. At the hospital, a nurse (or lactation consultant) will help you begin the process and will give you tips on good positioning. It may be  helpful to take a breastfeeding class before you deliver in order to know what to expect. How can I help my baby latch on? In order to assist your baby in latching-on, cup your breast in your hand and stroke your baby's lower lip with your nipple to stimulate your baby's rooting reflex. Your baby will look like he or she is yawning, at which point you should bring the baby towards your breast, while aiming the nipple at the roof of his or her mouth. Remember to bring the baby towards you and not your breast towards the baby. How can I tell if my baby is latched-on? Your baby will have all of your nipple and part of the dark area around the nipple in his or her mouth and your baby's nose will be touching your breast. You should see or hear the baby swallowing. If the baby is not latched-on properly, start the process over. To remove the suction, insert a clean finger between your breast and the baby's mouth. Should I switch breasts during feeding? After feeding on one side, switch the baby to your other breast. If he or she does not continue feeding - that is OK. Your baby will not necessarily need to feed from both breasts in a single feeding. On the next feeding, start with the other breast for efficiency and comfort. How can I tell if my baby is hungry? When your baby is hungry, they will nuzzle against your breast, make sucking noises and tongue motions and may put their hands near their mouth. Crying is a late sign of hunger, so you should not wait until this point. When they have received enough milk, they will unlatch from the breast. Is it okay to use a pacifier? Until your baby gets the hang of breastfeeding, experts recommend limiting pacifier usage. If you have questions about this, please contact your pediatrician. What can I do to ensure proper nutrition while breastfeeding? . Make sure that you support your own health and your baby's by eating a healthy, well-balanced diet . Your provider  may recommend that you continue to take your prenatal vitamin . Drink plenty of fluids. It is a good rule to drink one glass of water before or after feeding . Alcohol will remain in the breast milk for as long as it will remain in the blood stream. If you choose to have a drink, it is recommended that you wait at least 2 hours before feeding . Moderate amounts of caffeine are OK . Some over-the-counter or prescription medications are not recommended during breastfeeding. Check with your provider if you have questions What types of birth control methods are safe while breastfeeding? Progestin-only methods, including a daily pill, an IUD, the implant and the injection are safe while breastfeeding. Methods that contain estrogen (such as combination birth control pills, the vaginal ring and the patch) should not be used during the first month of breastfeeding as these can decrease your milk supply.    Braxton Hicks Contractions Contractions of the uterus can occur throughout pregnancy, but they are not always a sign that you are in labor. You may have practice contractions called Braxton Hicks contractions.  These false labor contractions are sometimes confused with true labor. What are Deberah PeltonBraxton Hicks contractions? Braxton Hicks contractions are tightening movements that occur in the muscles of the uterus before labor. Unlike true labor contractions, these contractions do not result in opening (dilation) and thinning of the cervix. Toward the end of pregnancy (32-34 weeks), Braxton Hicks contractions can happen more often and may become stronger. These contractions are sometimes difficult to tell apart from true labor because they can be very uncomfortable. You should not feel embarrassed if you go to the hospital with false labor. Sometimes, the only way to tell if you are in true labor is for your health care provider to look for changes in the cervix. The health care provider will do a physical exam and  may monitor your contractions. If you are not in true labor, the exam should show that your cervix is not dilating and your water has not broken. If there are other health problems associated with your pregnancy, it is completely safe for you to be sent home with false labor. You may continue to have Braxton Hicks contractions until you go into true labor. How to tell the difference between true labor and false labor True labor  Contractions last 30-70 seconds.  Contractions become very regular.  Discomfort is usually felt in the top of the uterus, and it spreads to the lower abdomen and low back.  Contractions do not go away with walking.  Contractions usually become more intense and increase in frequency.  The cervix dilates and gets thinner. False labor  Contractions are usually shorter and not as strong as true labor contractions.  Contractions are usually irregular.  Contractions are often felt in the front of the lower abdomen and in the groin.  Contractions may go away when you walk around or change positions while lying down.  Contractions get weaker and are shorter-lasting as time goes on.  The cervix usually does not dilate or become thin. Follow these instructions at home:  Take over-the-counter and prescription medicines only as told by your health care provider.  Keep up with your usual exercises and follow other instructions from your health care provider.  Eat and drink lightly if you think you are going into labor.  If Braxton Hicks contractions are making you uncomfortable: ? Change your position from lying down or resting to walking, or change from walking to resting. ? Sit and rest in a tub of warm water. ? Drink enough fluid to keep your urine pale yellow. Dehydration may cause these contractions. ? Do slow and deep breathing several times an hour.  Keep all follow-up prenatal visits as told by your health care provider. This is important. Contact a  health care provider if:  You have a fever.  You have continuous pain in your abdomen. Get help right away if:  Your contractions become stronger, more regular, and closer together.  You have fluid leaking or gushing from your vagina.  You pass blood-tinged mucus (bloody show).  You have bleeding from your vagina.  You have low back pain that you never had before.  You feel your baby's head pushing down and causing pelvic pressure.  Your baby is not moving inside you as much as it used to. Summary  Contractions that occur before labor are called Braxton Hicks contractions, false labor, or practice contractions.  Braxton Hicks contractions are usually shorter, weaker, farther apart, and less regular than true labor contractions. True labor contractions usually become progressively stronger  and regular and they become more frequent.  Manage discomfort from [facility] contractions by changing position, resting in a warm bath, drinking plenty of water, or practicing deep breathing. This information is not intended to replace advice given to you by your health care provider. Make sure you discuss any questions you have with your health care provider. Document Released: 08/28/2016 Document Revised: 08/28/2016 Document Reviewed: 08/28/2016 Elsevier Interactive Patient Education  2018 [employer].

## 2017-11-11 NOTE — Progress Notes (Signed)
    Subjective:  Rachel Olsen is a 22 y.o. G3P0020 at 6123w2d being seen today for ongoing prenatal care.  She is currently monitored for the following issues for this low-risk pregnancy and has Supervision of normal first pregnancy, antepartum; History of herpes genitalis; Anemia in pregnancy; and Genital warts complicating pregnancy on their problem list.  Patient reports occasional contractions.  Contractions: Not present. Vag. Bleeding: None.  Movement: Present. Denies leaking of fluid.   The following portions of the patient's history were reviewed and updated as appropriate: allergies, current medications, past family history, past medical history, past social history, past surgical history and problem list. Problem list updated.  Objective:   Vitals:   11/11/17 0854  BP: 116/68  Pulse: 90  Weight: 136 lb 6.4 oz (61.9 kg)    Fetal Status: Fetal Heart Rate (bpm): 138 Fundal Height: 36 cm Movement: Present     General:  Alert, oriented and cooperative. Patient is in no acute distress.  Skin: Skin is warm and dry. No rash noted.   Cardiovascular: Normal heart rate noted  Respiratory: Normal respiratory effort, no problems with respiration noted  Abdomen: Soft, gravid, appropriate for gestational age. Pain/Pressure: Present     Pelvic:  Cervical exam deferred        Extremities: Normal range of motion.  Edema: None  Mental Status: Normal mood and affect. Normal behavior. Normal judgment and thought content.   Urinalysis:      Assessment and Plan:  Pregnancy: G3P0020 at 2823w2d  1. Supervision of normal first pregnancy, antepartum Doing well.  Encouraged breastfeeding.  2. History of herpes genitalis Taking HSV suppression  3. Antepartum anemia Had 2 infusions of feraheme on 10-19-17 and 10-26-17.  4. Maternal condyloma acuminatum affecting pregnancy, antepartum Improvement seen since acid treatment last visit.  Still has Aldara and reviewed with client how to use on the  few remaining spots of HPV.  Term labor symptoms and general obstetric precautions including but not limited to vaginal bleeding, contractions, leaking of fluid and fetal movement were reviewed in detail with the patient. Please refer to After Visit Summary for other counseling recommendations.  Return in about 1 week (around 11/18/2017).  Nolene BernheimERRI BURLESON, RN, MSN, NP-BC Nurse Practitioner, [facility], [facility] 11/11/2017 9:22 AM

## 2017-11-11 NOTE — Progress Notes (Signed)
Patient reports good fetal movement with occasional pain with position changes.

## 2017-11-18 ENCOUNTER — Ambulatory Visit (INDEPENDENT_AMBULATORY_CARE_PROVIDER_SITE_OTHER): Payer: Medicaid Other | Admitting: Obstetrics

## 2017-11-18 DIAGNOSIS — Z3403 Encounter for supervision of normal first pregnancy, third trimester: Secondary | ICD-10-CM

## 2017-11-18 DIAGNOSIS — Z3400 Encounter for supervision of normal first pregnancy, unspecified trimester: Secondary | ICD-10-CM

## 2017-11-18 NOTE — Progress Notes (Signed)
Patient reports good fetal movement with pressure and irregular contractions. 

## 2017-11-19 ENCOUNTER — Encounter: Payer: Self-pay | Admitting: Obstetrics

## 2017-11-19 NOTE — Progress Notes (Signed)
Subjective:  Rachel Olsen is a 22 y.o. G3P0020 at 9160w3d being seen today for ongoing prenatal care.  She is currently monitored for the following issues for this low-risk pregnancy and has Supervision of normal first pregnancy, antepartum; History of herpes genitalis; Anemia in pregnancy; and Genital warts complicating pregnancy on their problem list.  Patient reports no complaints.  Contractions: Irregular. Vag. Bleeding: None.  Movement: Present. Denies leaking of fluid.   The following portions of the patient's history were reviewed and updated as appropriate: allergies, current medications, past family history, past medical history, past social history, past surgical history and problem list. Problem list updated.  Objective:   Vitals:   11/18/17 1540  BP: 119/70  Pulse: 91  Weight: 141 lb (64 kg)    Fetal Status: Fetal Heart Rate (bpm): 140   Movement: Present     General:  Alert, oriented and cooperative. Patient is in no acute distress.  Skin: Skin is warm and dry. No rash noted.   Cardiovascular: Normal heart rate noted  Respiratory: Normal respiratory effort, no problems with respiration noted  Abdomen: Soft, gravid, appropriate for gestational age. Pain/Pressure: Present     Pelvic:  Cervical exam deferred        Extremities: Normal range of motion.  Edema: Trace  Mental Status: Normal mood and affect. Normal behavior. Normal judgment and thought content.   Urinalysis:      Assessment and Plan:  Pregnancy: G3P0020 at 4960w3d  1. Supervision of normal first pregnancy, antepartum   Preterm labor symptoms and general obstetric precautions including but not limited to vaginal bleeding, contractions, leaking of fluid and fetal movement were reviewed in detail with the patient. Please refer to After Visit Summary for other counseling recommendations.  Return in about 1 week (around 11/25/2017) for ROB.   Brock BadHarper, Charles A, MD

## 2017-11-25 ENCOUNTER — Ambulatory Visit (INDEPENDENT_AMBULATORY_CARE_PROVIDER_SITE_OTHER): Payer: Medicaid Other | Admitting: Obstetrics

## 2017-11-25 ENCOUNTER — Encounter: Payer: Self-pay | Admitting: Obstetrics

## 2017-11-25 VITALS — BP 121/73 | HR 85 | Wt 142.0 lb

## 2017-11-25 DIAGNOSIS — Z3480 Encounter for supervision of other normal pregnancy, unspecified trimester: Secondary | ICD-10-CM

## 2017-11-25 DIAGNOSIS — Z3483 Encounter for supervision of other normal pregnancy, third trimester: Secondary | ICD-10-CM

## 2017-11-25 NOTE — Progress Notes (Signed)
Subjective:  Rachel Olsen is a 22 y.o. G3P0020 at 5786w2d being seen today for ongoing prenatal care.  She is currently monitored for the following issues for this low-risk pregnancy and has Supervision of normal first pregnancy, antepartum; History of herpes genitalis; Anemia in pregnancy; and Genital warts complicating pregnancy on their problem list.  Patient reports occasional contractions.  Contractions: Irregular. Vag. Bleeding: None.  Movement: Present. Denies leaking of fluid.   The following portions of the patient's history were reviewed and updated as appropriate: allergies, current medications, past family history, past medical history, past social history, past surgical history and problem list. Problem list updated.  Objective:   Vitals:   11/25/17 1505  BP: 121/73  Pulse: 85  Weight: 142 lb (64.4 kg)    Fetal Status: Fetal Heart Rate (bpm): 140   Movement: Present     General:  Alert, oriented and cooperative. Patient is in no acute distress.  Skin: Skin is warm and dry. No rash noted.   Cardiovascular: Normal heart rate noted  Respiratory: Normal respiratory effort, no problems with respiration noted  Abdomen: Soft, gravid, appropriate for gestational age. Pain/Pressure: Present     Pelvic:  Cvx:  2cm / 70% / -1 / Vtx  Extremities: Normal range of motion.  Edema: Trace  Mental Status: Normal mood and affect. Normal behavior. Normal judgment and thought content.   Urinalysis:      Assessment and Plan:  Pregnancy: G3P0020 at 7986w2d  1. Supervision of other normal pregnancy, antepartum   Term labor symptoms and general obstetric precautions including but not limited to vaginal bleeding, contractions, leaking of fluid and fetal movement were reviewed in detail with the patient. Please refer to After Visit Summary for other counseling recommendations.  Return in about 1 week (around 12/02/2017) for ROB.   Brock BadHarper, Charles A, MD

## 2017-11-27 ENCOUNTER — Telehealth (HOSPITAL_COMMUNITY): Payer: Self-pay | Admitting: *Deleted

## 2017-11-27 ENCOUNTER — Encounter (HOSPITAL_COMMUNITY): Payer: Self-pay | Admitting: *Deleted

## 2017-11-27 NOTE — Telephone Encounter (Signed)
Preadmission screen  

## 2017-12-02 ENCOUNTER — Other Ambulatory Visit: Payer: Self-pay

## 2017-12-02 ENCOUNTER — Encounter: Payer: Self-pay | Admitting: Obstetrics

## 2017-12-02 ENCOUNTER — Inpatient Hospital Stay (HOSPITAL_COMMUNITY)
Admission: AD | Admit: 2017-12-02 | Discharge: 2017-12-05 | DRG: 805 | Disposition: A | Discharge status: Home / Self Care | Payer: Medicaid Other | Source: Ambulatory Visit | Attending: Obstetrics and Gynecology | Admitting: Obstetrics and Gynecology

## 2017-12-02 ENCOUNTER — Encounter (HOSPITAL_COMMUNITY): Payer: Self-pay

## 2017-12-02 ENCOUNTER — Inpatient Hospital Stay (HOSPITAL_BASED_OUTPATIENT_CLINIC_OR_DEPARTMENT_OTHER): Payer: Medicaid Other

## 2017-12-02 ENCOUNTER — Ambulatory Visit (INDEPENDENT_AMBULATORY_CARE_PROVIDER_SITE_OTHER): Payer: Medicaid Other | Admitting: Obstetrics

## 2017-12-02 VITALS — BP 119/69 | HR 78 | Wt 145.2 lb

## 2017-12-02 DIAGNOSIS — A6000 Herpesviral infection of urogenital system, unspecified: Secondary | ICD-10-CM | POA: Diagnosis present

## 2017-12-02 DIAGNOSIS — Z3400 Encounter for supervision of normal first pregnancy, unspecified trimester: Secondary | ICD-10-CM

## 2017-12-02 DIAGNOSIS — O411230 Chorioamnionitis, third trimester, not applicable or unspecified: Secondary | ICD-10-CM | POA: Diagnosis present

## 2017-12-02 DIAGNOSIS — Z3480 Encounter for supervision of other normal pregnancy, unspecified trimester: Secondary | ICD-10-CM

## 2017-12-02 DIAGNOSIS — O9832 Other infections with a predominantly sexual mode of transmission complicating childbirth: Secondary | ICD-10-CM | POA: Diagnosis present

## 2017-12-02 DIAGNOSIS — Z8759 Personal history of other complications of pregnancy, childbirth and the puerperium: Secondary | ICD-10-CM

## 2017-12-02 DIAGNOSIS — O99019 Anemia complicating pregnancy, unspecified trimester: Secondary | ICD-10-CM | POA: Diagnosis present

## 2017-12-02 DIAGNOSIS — Z3A40 40 weeks gestation of pregnancy: Secondary | ICD-10-CM

## 2017-12-02 DIAGNOSIS — Z3483 Encounter for supervision of other normal pregnancy, third trimester: Secondary | ICD-10-CM

## 2017-12-02 DIAGNOSIS — D509 Iron deficiency anemia, unspecified: Secondary | ICD-10-CM | POA: Diagnosis present

## 2017-12-02 DIAGNOSIS — O9902 Anemia complicating childbirth: Secondary | ICD-10-CM | POA: Diagnosis present

## 2017-12-02 DIAGNOSIS — O480 Post-term pregnancy: Secondary | ICD-10-CM | POA: Diagnosis not present

## 2017-12-02 DIAGNOSIS — O288 Other abnormal findings on antenatal screening of mother: Secondary | ICD-10-CM | POA: Diagnosis present

## 2017-12-02 DIAGNOSIS — Z8619 Personal history of other infectious and parasitic diseases: Secondary | ICD-10-CM

## 2017-12-02 LAB — CBC
HCT: 36 % (ref 36.0–46.0)
Hemoglobin: 12 g/dL (ref 12.0–15.0)
MCH: 32.1 pg (ref 26.0–34.0)
MCHC: 33.3 g/dL (ref 30.0–36.0)
MCV: 96.3 fL (ref 78.0–100.0)
PLATELETS: 223 10*3/uL (ref 150–400)
RBC: 3.74 MIL/uL — ABNORMAL LOW (ref 3.87–5.11)
RDW: 19.6 % — AB (ref 11.5–15.5)
WBC: 8.6 10*3/uL (ref 4.0–10.5)

## 2017-12-02 LAB — URINALYSIS, ROUTINE W REFLEX MICROSCOPIC
Bilirubin Urine: NEGATIVE
Glucose, UA: NEGATIVE mg/dL
Ketones, ur: NEGATIVE mg/dL
Nitrite: NEGATIVE
Protein, ur: NEGATIVE mg/dL
Specific Gravity, Urine: 1.015 (ref 1.005–1.030)
pH: 7 (ref 5.0–8.0)

## 2017-12-02 LAB — TYPE AND SCREEN
ABO/RH(D): O POS
Antibody Screen: NEGATIVE

## 2017-12-02 MED ORDER — ONDANSETRON HCL 4 MG/2ML IJ SOLN
4.0000 mg | Freq: Four times a day (QID) | INTRAMUSCULAR | Status: DC | PRN
  Administered 2017-12-03: 4 mg via INTRAVENOUS
  Filled 2017-12-02: qty 2

## 2017-12-02 MED ORDER — LACTATED RINGERS IV SOLN
500.0000 mL | INTRAVENOUS | Status: DC | PRN
  Administered 2017-12-03: 300 mL via INTRAVENOUS

## 2017-12-02 MED ORDER — MISOPROSTOL 25 MCG QUARTER TABLET
25.0000 ug | ORAL_TABLET | ORAL | Status: DC | PRN
  Administered 2017-12-02: 25 ug via VAGINAL
  Filled 2017-12-02 (×2): qty 1

## 2017-12-02 MED ORDER — OXYTOCIN BOLUS FROM INFUSION
500.0000 mL | Freq: Once | INTRAVENOUS | Status: AC
  Administered 2017-12-03: 500 mL via INTRAVENOUS

## 2017-12-02 MED ORDER — SOD CITRATE-CITRIC ACID 500-334 MG/5ML PO SOLN
30.0000 mL | ORAL | Status: DC | PRN
  Filled 2017-12-02: qty 15

## 2017-12-02 MED ORDER — ACETAMINOPHEN 325 MG PO TABS
650.0000 mg | ORAL_TABLET | ORAL | Status: DC | PRN
  Administered 2017-12-03: 650 mg via ORAL
  Filled 2017-12-02: qty 2

## 2017-12-02 MED ORDER — TERBUTALINE SULFATE 1 MG/ML IJ SOLN
0.2500 mg | Freq: Once | INTRAMUSCULAR | Status: DC | PRN
  Filled 2017-12-02: qty 1

## 2017-12-02 MED ORDER — LIDOCAINE HCL (PF) 1 % IJ SOLN
30.0000 mL | INTRAMUSCULAR | Status: DC | PRN
  Filled 2017-12-02: qty 30

## 2017-12-02 MED ORDER — OXYTOCIN 40 UNITS IN LACTATED RINGERS INFUSION - SIMPLE MED
2.5000 [IU]/h | INTRAVENOUS | Status: DC
  Filled 2017-12-02: qty 1000

## 2017-12-02 MED ORDER — FENTANYL CITRATE (PF) 100 MCG/2ML IJ SOLN
100.0000 ug | INTRAMUSCULAR | Status: DC | PRN
  Administered 2017-12-02 – 2017-12-03 (×3): 100 ug via INTRAVENOUS
  Filled 2017-12-02 (×3): qty 2

## 2017-12-02 MED ORDER — LACTATED RINGERS IV SOLN
INTRAVENOUS | Status: DC
  Administered 2017-12-02 – 2017-12-03 (×5): via INTRAVENOUS

## 2017-12-02 MED ORDER — VALACYCLOVIR HCL 500 MG PO TABS
500.0000 mg | ORAL_TABLET | Freq: Two times a day (BID) | ORAL | Status: DC
  Administered 2017-12-03: 500 mg via ORAL
  Filled 2017-12-02 (×4): qty 1

## 2017-12-02 NOTE — Anesthesia Pain Management Evaluation Note (Signed)
  CRNA Pain Management Visit Note  Patient: Rachel Olsen, 22 y.o., female  "Hello I am a [facility] of the [facility]. We have an anesthesia team available at all times to provide care throughout the hospital, including epidural management and anesthesia for C-section. I don't know your plan for the delivery whether it a natural birth, water birth, IV sedation, nitrous supplementation, doula or epidural, but we want to meet your pain goals."   1.Was your pain managed to your expectations on prior hospitalizations?   Yes   2.What is your expectation for pain management during this hospitalization?     IV pain meds  3.How can we help you reach that goal? IV pain Rx  Record the patient's initial score and the patient's pain goal.   Pain: 7  Pain Goal: 10 [facility] wants you to be able to say your pain was always managed very well.  Riane Rung 12/02/2017

## 2017-12-02 NOTE — Progress Notes (Addendum)
Labor and Delivery Progress Note  21yo [id number] at 5166w2d who presents from clinic with NRNST and admitted for same with BPPP 6/10 on presentation to MAU. Labor an delivery complicated by iron deficiency anemia (s/p feraheme infusions), HSV (on prophylaxis.   Induction of Labor: Latent.  -- FB placed at 1530 -- s/p cytotec x 1   FWB: Cephalic by BSUS. EFW 6lbs by Le[place]. -- GBS(-) -- continuous fetal monitoring: category I strip   HSV: No genital lesions on speculum exam.

## 2017-12-02 NOTE — Progress Notes (Addendum)
Subjective:  Rachel Olsen is a 22 y.o. G3P0020 at 720w2d being seen today for ongoing prenatal care.  She is currently monitored for the following issues for this low-risk pregnancy and has Supervision of normal first pregnancy, antepartum; History of herpes genitalis; Anemia in pregnancy; and Genital warts complicating pregnancy on their problem list.  Patient reports no complaints.  Contractions: Irregular. Vag. Bleeding: None.  Movement: Present. Denies leaking of fluid.   The following portions of the patient's history were reviewed and updated as appropriate: allergies, current medications, past family history, past medical history, past social history, past surgical history and problem list. Problem list updated.  Objective:   Vitals:   12/02/17 0935  BP: 119/69  Pulse: 78  Weight: 145 lb 3.2 oz (65.9 kg)    Fetal Status:     Movement: Present     General:  Alert, oriented and cooperative. Patient is in no acute distress.  Skin: Skin is warm and dry. No rash noted.   Cardiovascular: Normal heart rate noted  Respiratory: Normal respiratory effort, no problems with respiration noted  Abdomen: Soft, gravid, appropriate for gestational age. Pain/Pressure: Present     Pelvic:  Cervical exam deferred        Extremities: Normal range of motion.  Edema: Trace  Mental Status: Normal mood and affect. Normal behavior. Normal judgment and thought content.   Urinalysis:      Assessment and Plan:  Pregnancy: G3P0020 at 1720w2d  1. Supervision of other normal pregnancy, antepartum Rx: - Fetal nonstress test:  Non Reactive.  Baseline FHR 150's.  Fair variability.  5-10 beat accels, no decels.  Ir[facility]  Term labor symptoms and general obstetric precautions including but not limited to vaginal bleeding, contractions, leaking of fluid and fetal movement were reviewed in detail with the patient. Please refer to After Visit Summary  for other counseling recommendations.  Return for Postpartum.   Brock BadHarper, Charles A, MD

## 2017-12-02 NOTE — Progress Notes (Signed)
Patient ID: Rachel DutyLizbeth Kirkland, female   DOB: 06-13-1995, 21 y.o.   MRN: [id number]  Introduced self to patient.  She is having painful contractions every 2-3 minutes. Does not want epidural.  But desired some medicine for the painful contractions.  Otherwise she has no complaints or symptoms. Will recheck cervix again soon and decide whether to start pitocin.   FHT: 140 bpm. Mod variability.  Accels.  3-4 variables seen.  Cat. II.    BP 123/70 (BP Location: Right Arm)   Pulse 85   Temp 98.3 F (36.8 C) (Oral)   Resp 18   Ht 5\' 2"  (1.575 m)   Wt 65.9 kg (145 lb 3.2 oz)   LMP 02/09/2017 (Approximate)   SpO2 99%   BMI 26.56 kg/m   Dilation: 5 Effacement (%): 80 Station: -2 Presentation: Vertex Exam by:: Dr. Earlene PlaterWallace

## 2017-12-02 NOTE — Progress Notes (Addendum)
G3P0 @ 40.[redacted] wksga. [facility] dt non reactive FM in the office. Denies LOF or bleeding. States +FM. Pt states had a piece of bread and water this morning at 0830. Denies pain. Does feel the ctx but not painful  EFM applied.   1125: Turned pt to left lateral side.   BP 129/76   Pulse 81   Temp 98.6 F (37 C) (Oral)   Resp 18   Ht 5\' 2"  (1.575 m)   LMP 02/09/2017 (Approximate)   SpO2 99%   BMI 26.56 kg/m   1135: orders received for BPP and reflex urine. Flagged U/S   1221: pt to U/S 1257: back from U/s. Instructed tech to ask provider if pt needs to be placed back on the monitor.  1303: EFM reapplied.   1309 provider at bs for SVE: 1/70/-2 vertex. BPP results review with the pt 6/8.   1314: provider at bs informing pt she is admitted.  1319: provider Suzette BattiestVeronica will contact birthing faculty oncall.   1321: birthing charge nurse notified. Report status of pt given. Room assignment pending.   1334: Labs drawn and IV saline lock placed  1359: pt to birthing via wheelchair to Room 165

## 2017-12-02 NOTE — H&P (Signed)
LABOR AND DELIVERY ADMISSION HISTORY AND PHYSICAL NOTE  Rachel Olsen is a 22 y.o. female G36P0020 with IUP at [redacted]w[redacted]d by 6 week [place] presenting to MAU for BPP. She was sent over from the office for NR NST. She was in the office for routine prenatal appointment. She denies complications during this pregnancy. Hx of HSV on suppression. She reports irregular contractions that have been occurring for 2 days- she denies them performing a cervical examination in the office. She reports good fetal movement, denies LOF, vaginal bleeding.   Prenatal History/Complications: PNC at [facility] Pregnancy complications:  - HSV, on suppression  - Anemia during pregnancy   Past Medical History: Past Medical History:  Diagnosis Date  . Anemia   . Condyloma   . Genital herpes     Past Surgical History: Past Surgical History:  Procedure Laterality Date  . NO PAST SURGERIES      Obstetrical History: OB History    Gravida  3   Para  0   Term      Preterm      AB  2   Living  0     SAB  1   TAB  1   Ectopic      Multiple      Live Births              Social History: Social History   Socioeconomic History  . Marital status: Married    Spouse name: Not on file  . Number of children: Not on file  . Years of education: Not on file  . Highest education level: Not on file  Occupational History  . Not on file  Social Needs  . Financial resource strain: Not on file  . Food insecurity:    Worry: Not on file    Inability: Not on file  . Transportation needs:    Medical: Not on file    Non-medical: Not on file  Tobacco Use  . Smoking status: Never Smoker  . Smokeless tobacco: Never Used  Substance and Sexual Activity  . Alcohol use: No    Frequency: Never  . Drug use: No  . Sexual activity: Yes    Birth control/protection: None  Lifestyle  . Physical activity:    Days per week: Not on file    Minutes per session: Not on file  . Stress: Not on file  Relationships   . Social connections:    Talks on phone: Not on file    Gets together: Not on file    Attends religious service: Not on file    Active member of club or organization: Not on file    Attends meetings of clubs or organizations: Not on file    Relationship status: Not on file  Other Topics Concern  . Not on file  Social History Narrative  . Not on file    Family History: Family History  Problem Relation Age of Onset  . Diabetes Sister   . Diabetes Maternal Aunt   . Hypertension Maternal Aunt   . Diabetes Maternal Uncle   . Hypertension Maternal Uncle   . Diabetes Maternal Grandmother   . Hypertension Maternal Grandmother   . Diabetes Maternal Grandfather   . Hypertension Maternal Grandfather     Allergies: No Known Allergies  Medications Prior to Admission  Medication Sig Dispense Refill Last Dose  . imiquimod (ALDARA) 5 % cream Apply topically 3 (three) times a week. 12 each 0 Past Month at Unknown time  .  Prenatal-DSS-FeCb-FeGl-FA (CITRANATAL BLOOM) 90-1 MG TABS Take 1 tablet by mouth daily. 30 tablet 12 Past Week at Unknown time  . valACYclovir (VALTREX) 1000 MG tablet Take 1 tablet (1,000 mg total) by mouth 2 (two) times daily. 60 tablet 2 Past Week at Unknown time     Review of Systems  All systems reviewed and negative except as stated in HPI  Physical Exam Blood pressure 129/76, pulse 81, temperature 98.6 F (37 C), temperature source Oral, resp. rate 18, height 5\' 2"  (1.575 m), last menstrual period 02/09/2017, SpO2 99 %. General appearance: alert, cooperative and no distress Lungs: clear to auscultation bilaterally Heart: regular rate and rhythm Abdomen: soft, non-tender; bowel sounds normal Extremities: No calf swelling or tenderness Presentation: cephalic by cervical examination  Fetal monitoring: 140, minimal variability, +accelerations, no deceleration  Uterine activity: 2-7 minutes/ moderate by palpation  Dilation: 1 Effacement (%): 70 Station:  -2 Exam by:: Steward Drone CNM  MDM:  Orders Placed This Encounter  Procedures  . [place] MFM Fetal BPP Wo Non Stress  . Urinalysis, Routine w reflex microscopic   UA- negative  BPP- ultrasound reviewed and showed 6/8 BPP with non reactive NST totaling 6/10. Patient is postdates. C/w Dr Debroah Loop will admit patient to L&D for delivery.   Prenatal labs: ABO, Rh: O/Positive/-- (01/07 1103) Antibody: Negative (01/07 1103) Rubella: 1.45 (01/07 1103) RPR: Non Reactive (05/07 1051)  HBsAg: Negative (01/07 1103)  HIV: Non Reactive (05/07 1051)  GC/Chlamydia: Negative (7/10) GBS: Negative (07/10 0928)  2 hr Glucola: 81-139-122 Genetic screening:  Declined  Anatomy US: Normal Female   Clinic [facility] Prenatal Labs  Dating  6 week sono Blood type: O/Positive/-- (01/07 1103)   Genetic Screen Declined all modalities Antibody:Negative (01/07 1103)  Anatomic [place] Normal Rubella: 1.45 (01/07 1103)  GTT Third trimester: WNL RPR: Non Reactive (05/07 1051)   Flu vaccine  declined HBsAg: Negative (01/07 1103)   TDaP vaccine   Declined 09/01/17                                          HIV: negative  Baby Food     Breast                                        [id number] (07/10 9811)  Contraception    None Pap:N/A  Circumcision     No   Pediatrician    Dr Donnie Coffin CF: negative  Support Person   Clifton Custard SMA: reduced risks  Prenatal Classes   No Hgb electrophoresis: AA   Prenatal Transfer Tool  Maternal Diabetes: No Genetic Screening: Declined Maternal Ultrasounds/Referrals: Normal Fetal Ultrasounds or other Referrals:  None Maternal Substance Abuse:  No Significant Maternal Medications:  Meds include: Other: Valtrex  Significant Maternal Lab Results: Lab values include: Group B Strep negative  Results for orders placed or performed during the hospital encounter of 12/02/17 (from the past 24 hour(s))  Urinalysis, Routine w reflex microscopic   Collection Time: 12/02/17 11:18 AM  Result Value Ref Range    Color, Urine YELLOW YELLOW   APPearance HAZY (A) CLEAR   Specific Gravity, Urine 1.015 1.005 - 1.030   pH 7.0 5.0 - 8.0   Glucose, UA NEGATIVE NEGATIVE mg/dL   Hgb urine dipstick MODERATE (A) NEGATIVE   Bilirubin  Urine NEGATIVE NEGATIVE   Ketones, ur NEGATIVE NEGATIVE mg/dL   Protein, ur NEGATIVE NEGATIVE mg/dL   Nitrite NEGATIVE NEGATIVE   Leukocytes, UA LARGE (A) NEGATIVE   RBC / HPF 0-5 0 - 5 RBC/hpf   WBC, UA 11-20 0 - 5 WBC/hpf   Bacteria, UA RARE (A) NONE SEEN   Squamous Epithelial / LPF 11-20 0 - 5   Mucus PRESENT     Patient Active Problem List   Diagnosis Date Noted  . Genital warts complicating pregnancy 09/29/2017  . Anemia in pregnancy 09/15/2017  . Supervision of normal first pregnancy, antepartum 05/04/2017  . History of herpes genitalis 05/04/2017    Assessment: Rachel Olsen is a 22 y.o. G3P0020 at 625w2d being admitted to L&D for delivery for 6/8 BPP and NRNST.   #Labor: IOL with FB  #Pain: Pain medication ordered PRN  #FWB: Cat II #ID:  GBS neg  #MOF: Breast  #MOC: Declined  #Circ:  No   Sharyon CableRogers, Abbas Beyene C, CNM 12/02/2017, 1:28 PM

## 2017-12-02 NOTE — MAU Note (Signed)
Urine in lab 

## 2017-12-02 NOTE — Progress Notes (Signed)
Theresa DutyLizbeth Barre is 22 y.o. 663P0020 female at 5347w2d who presents for IOL for NRNST and BPP 6/10. Patient s/p cytotec x1 and FB. Now feeling ctx more frequently and strongly.   Dilation: 5 Effacement (%): 80 Station: -2 Presentation: Vertex Exam by:: Dr. Earlene PlaterWallace   FHT: 145 bpm, good variability, +acels, occ variable decel  Toco: q2-4 min   Labor progressing and patient contracting well on her own. Will hold off on further augmentation at present. Discussed option of Pitocin during this labor; patient agreeable if necessary.   Patient declines epidural. Is aware that Fentanyl is available prn.   Marcy Sirenatherine Wallace, D.O. OB Fellow  12/02/2017, 7:33 PM

## 2017-12-03 ENCOUNTER — Encounter (HOSPITAL_COMMUNITY): Payer: Self-pay | Admitting: Anesthesiology

## 2017-12-03 ENCOUNTER — Inpatient Hospital Stay (HOSPITAL_COMMUNITY): Payer: Medicaid Other | Admitting: Anesthesiology

## 2017-12-03 DIAGNOSIS — Z3A40 40 weeks gestation of pregnancy: Secondary | ICD-10-CM

## 2017-12-03 DIAGNOSIS — O480 Post-term pregnancy: Secondary | ICD-10-CM

## 2017-12-03 DIAGNOSIS — O411230 Chorioamnionitis, third trimester, not applicable or unspecified: Secondary | ICD-10-CM

## 2017-12-03 LAB — RPR: RPR Ser Ql: NONREACTIVE

## 2017-12-03 MED ORDER — TERBUTALINE SULFATE 1 MG/ML IJ SOLN
0.2500 mg | Freq: Once | INTRAMUSCULAR | Status: DC | PRN
  Filled 2017-12-03: qty 1

## 2017-12-03 MED ORDER — BENZOCAINE-MENTHOL 20-0.5 % EX AERO
1.0000 "application " | INHALATION_SPRAY | CUTANEOUS | Status: DC | PRN
  Administered 2017-12-03: 1 via TOPICAL
  Filled 2017-12-03: qty 56

## 2017-12-03 MED ORDER — PHENYLEPHRINE 40 MCG/ML (10ML) SYRINGE FOR IV PUSH (FOR BLOOD PRESSURE SUPPORT)
PREFILLED_SYRINGE | INTRAVENOUS | Status: AC
  Administered 2017-12-03: 80 ug via INTRAVENOUS
  Filled 2017-12-03: qty 20

## 2017-12-03 MED ORDER — GENTAMICIN SULFATE 40 MG/ML IJ SOLN
130.0000 mg | Freq: Three times a day (TID) | INTRAMUSCULAR | Status: DC
  Administered 2017-12-03: 130 mg via INTRAVENOUS
  Filled 2017-12-03 (×3): qty 3.25

## 2017-12-03 MED ORDER — DIBUCAINE 1 % RE OINT: 1.0000 "application " | TOPICAL_OINTMENT | RECTAL | Status: DC | PRN

## 2017-12-03 MED ORDER — OXYTOCIN 40 UNITS IN LACTATED RINGERS INFUSION - SIMPLE MED
1.0000 m[IU]/min | INTRAVENOUS | Status: DC
  Administered 2017-12-03: 2 m[IU]/min via INTRAVENOUS

## 2017-12-03 MED ORDER — LACTATED RINGERS AMNIOINFUSION
INTRAVENOUS | Status: DC
  Administered 2017-12-03: 11:00:00 via INTRAUTERINE
  Filled 2017-12-03 (×2): qty 1000

## 2017-12-03 MED ORDER — PRENATAL MULTIVITAMIN CH
1.0000 | ORAL_TABLET | Freq: Every day | ORAL | Status: DC
  Administered 2017-12-04 – 2017-12-05 (×2): 1 via ORAL
  Filled 2017-12-03 (×2): qty 1

## 2017-12-03 MED ORDER — SENNOSIDES-DOCUSATE SODIUM 8.6-50 MG PO TABS
2.0000 | ORAL_TABLET | ORAL | Status: DC
  Administered 2017-12-03 – 2017-12-04 (×2): 2 via ORAL
  Filled 2017-12-03 (×2): qty 2

## 2017-12-03 MED ORDER — IBUPROFEN 600 MG PO TABS
600.0000 mg | ORAL_TABLET | Freq: Four times a day (QID) | ORAL | Status: DC
  Administered 2017-12-03 – 2017-12-05 (×8): 600 mg via ORAL
  Filled 2017-12-03 (×8): qty 1

## 2017-12-03 MED ORDER — ACETAMINOPHEN 325 MG PO TABS
650.0000 mg | ORAL_TABLET | ORAL | Status: DC | PRN
  Administered 2017-12-04 (×2): 650 mg via ORAL
  Filled 2017-12-03 (×2): qty 2

## 2017-12-03 MED ORDER — METHYLERGONOVINE MALEATE 0.2 MG/ML IJ SOLN
INTRAMUSCULAR | Status: AC
  Filled 2017-12-03: qty 1

## 2017-12-03 MED ORDER — FENTANYL 2.5 MCG/ML BUPIVACAINE 1/10 % EPIDURAL INFUSION (WH - ANES)
12.0000 mL/h | INTRAMUSCULAR | Status: DC | PRN
  Administered 2017-12-03 (×2): 12 mL/h via EPIDURAL
  Filled 2017-12-03: qty 100

## 2017-12-03 MED ORDER — LIDOCAINE HCL (PF) 1 % IJ SOLN
INTRAMUSCULAR | Status: DC | PRN
  Administered 2017-12-03: 3 mL via EPIDURAL
  Administered 2017-12-03: 4 mL via EPIDURAL

## 2017-12-03 MED ORDER — DIPHENHYDRAMINE HCL 25 MG PO CAPS: 25.0000 mg | ORAL_CAPSULE | Freq: Four times a day (QID) | ORAL | Status: DC | PRN

## 2017-12-03 MED ORDER — SIMETHICONE 80 MG PO CHEW: 80.0000 mg | CHEWABLE_TABLET | ORAL | Status: DC | PRN

## 2017-12-03 MED ORDER — ONDANSETRON HCL 4 MG/2ML IJ SOLN: 4.0000 mg | INTRAMUSCULAR | Status: DC | PRN

## 2017-12-03 MED ORDER — COCONUT OIL OIL: 1.0000 "application " | TOPICAL_OIL | Status: DC | PRN

## 2017-12-03 MED ORDER — ONDANSETRON HCL 4 MG PO TABS: 4.0000 mg | ORAL_TABLET | ORAL | Status: DC | PRN

## 2017-12-03 MED ORDER — OXYCODONE HCL 5 MG PO TABS: 5.0000 mg | ORAL_TABLET | ORAL | Status: DC | PRN

## 2017-12-03 MED ORDER — WITCH HAZEL-GLYCERIN EX PADS: 1.0000 "application " | MEDICATED_PAD | CUTANEOUS | Status: DC | PRN

## 2017-12-03 MED ORDER — OXYCODONE HCL 5 MG PO TABS: 10.0000 mg | ORAL_TABLET | ORAL | Status: DC | PRN

## 2017-12-03 MED ORDER — TETANUS-DIPHTH-ACELL PERTUSSIS 5-2.5-18.5 LF-MCG/0.5 IM SUSP
0.5000 mL | Freq: Once | INTRAMUSCULAR | Status: DC
  Filled 2017-12-03: qty 0.5

## 2017-12-03 MED ORDER — GENTAMICIN SULFATE 40 MG/ML IJ SOLN
130.0000 mg | Freq: Three times a day (TID) | INTRAVENOUS | Status: DC
  Administered 2017-12-03: 130 mg via INTRAVENOUS
  Filled 2017-12-03 (×2): qty 3.25

## 2017-12-03 MED ORDER — LACTATED RINGERS IV SOLN
500.0000 mL | Freq: Once | INTRAVENOUS | Status: DC
  Administered 2017-12-03: 1000 mL via INTRAVENOUS

## 2017-12-03 MED ORDER — PHENYLEPHRINE 40 MCG/ML (10ML) SYRINGE FOR IV PUSH (FOR BLOOD PRESSURE SUPPORT)
80.0000 ug | PREFILLED_SYRINGE | INTRAVENOUS | Status: DC | PRN
  Administered 2017-12-03 (×2): 80 ug via INTRAVENOUS
  Filled 2017-12-03: qty 5

## 2017-12-03 MED ORDER — ZOLPIDEM TARTRATE 5 MG PO TABS: 5.0000 mg | ORAL_TABLET | Freq: Every evening | ORAL | Status: DC | PRN

## 2017-12-03 MED ORDER — METHYLERGONOVINE MALEATE 0.2 MG/ML IJ SOLN
0.2000 mg | Freq: Once | INTRAMUSCULAR | Status: AC
  Administered 2017-12-03: 0.2 mg via INTRAMUSCULAR

## 2017-12-03 MED ORDER — FENTANYL 2.5 MCG/ML BUPIVACAINE 1/10 % EPIDURAL INFUSION (WH - ANES)
INTRAMUSCULAR | Status: AC
  Filled 2017-12-03: qty 100

## 2017-12-03 MED ORDER — SODIUM CHLORIDE 0.9 % IV SOLN
2.0000 g | Freq: Four times a day (QID) | INTRAVENOUS | Status: DC
  Administered 2017-12-03: 2 g via INTRAVENOUS
  Filled 2017-12-03 (×3): qty 2000
  Filled 2017-12-03: qty 2

## 2017-12-03 MED ORDER — SODIUM CHLORIDE 0.9 % IV SOLN
2.0000 g | Freq: Once | INTRAVENOUS | Status: AC
  Administered 2017-12-03: 2 g via INTRAVENOUS
  Filled 2017-12-03: qty 2

## 2017-12-03 MED ORDER — DIPHENHYDRAMINE HCL 50 MG/ML IJ SOLN: 12.5000 mg | INTRAMUSCULAR | Status: DC | PRN

## 2017-12-03 NOTE — Progress Notes (Signed)
Faculty Note  Patient with elevated temp and fetal tachycardia, will start amp/gent for chorioamnionitis. She is 9.5/90/+2. With some deep variables with contractions, will start amnioinfusion, lip not reducible. Good variability in between and strip overall reassuring. Reviewed that if strip is worsening, will recommend proceeding with delivery, patient verbalizes understanding.    Baldemar LenisK. Meryl Davis, M.D. [facility]

## 2017-12-03 NOTE — Anesthesia Preprocedure Evaluation (Signed)
Anesthesia Evaluation  Patient identified by MRN, date of birth, ID band Patient awake    Reviewed: Allergy & Precautions, Patient's Chart, lab work & pertinent test results  Airway Mallampati: II  TM Distance: >3 FB Neck ROM: Full    Dental no notable dental hx. (+) Teeth Intact   Pulmonary neg pulmonary ROS,    Pulmonary exam normal breath sounds clear to auscultation       Cardiovascular negative cardio ROS Normal cardiovascular exam Rhythm:Regular Rate:Normal     Neuro/Psych negative neurological ROS  negative psych ROS   GI/Hepatic negative GI ROS, Neg liver ROS,   Endo/Other  negative endocrine ROS  Renal/GU negative Renal ROS  negative genitourinary   Musculoskeletal negative musculoskeletal ROS (+)   Abdominal   Peds  Hematology  (+) anemia ,   Anesthesia Other Findings   Reproductive/Obstetrics HSV HPV                             Anesthesia Physical Anesthesia Plan  ASA: II  Anesthesia Plan: Epidural   Post-op Pain Management:    Induction:   PONV Risk Score and Plan:   Airway Management Planned: Natural Airway  Additional Equipment:   Intra-op Plan:   Post-operative Plan:   Informed Consent: I have reviewed the patients History and Physical, chart, labs and discussed the procedure including the risks, benefits and alternatives for the proposed anesthesia with the patient or authorized representative who has indicated his/her understanding and acceptance.     Plan Discussed with: Anesthesiologist  Anesthesia Plan Comments:         Anesthesia Quick Evaluation

## 2017-12-03 NOTE — Progress Notes (Signed)
Patient ID: Rachel DutyLizbeth Tandy, female   DOB: Oct 18, 1995, 21 y.o.   MRN: [id number]  Micah FlesherWent to patient's room and AROM'd her at 0145. Small amount of clear fluid was released. She is still having painful contractions.  On this cevical exam, the patient appears to be less dilated than previous exam.  From 8 to 6cm.    FHT: 140 bpm. Min-mod variability. Reactive. No decels.    BP 125/72   Pulse 81   Temp 98.9 F (37.2 C) (Oral)   Resp 20   Ht 5\' 2"  (1.575 m)   Wt 65.9 kg   LMP 02/09/2017 (Approximate)   SpO2 99%   BMI 26.56 kg/m   Dilation: 6 Effacement (%): 100 Station: -1 Presentation: Vertex Exam by:: Clare CharonKristen Faucett RNC

## 2017-12-03 NOTE — Addendum Note (Signed)
Addendum  created 12/03/17 1742 by Shanon PayorGregory, Cianna Kasparian M, CRNA   Charge Capture section accepted, Sign clinical note

## 2017-12-03 NOTE — Anesthesia Postprocedure Evaluation (Signed)
Anesthesia Post Note  Patient: Rachel Olsen  Procedure(s) Performed: AN AD HOC LABOR EPIDURAL     Patient location during evaluation: Mother Baby Anesthesia Type: Epidural Level of consciousness: awake and alert and oriented Pain management: satisfactory to patient Vital Signs Assessment: post-procedure vital signs reviewed and stable Respiratory status: spontaneous breathing and nonlabored ventilation Cardiovascular status: stable Postop Assessment: no headache, no backache, no signs of nausea or vomiting, adequate PO intake and patient able to bend at knees (patient up walking) Anesthetic complications: no    Last Vitals:  Vitals:   12/03/17 1445 12/03/17 1546  BP: 114/74 123/71  Pulse: 83 (!) 102  Resp: 18 18  Temp: 37.2 C 36.8 C  SpO2:  100%    Last Pain:  Vitals:   12/03/17 1546  TempSrc: Oral  PainSc:    Pain Goal: Patients Stated Pain Goal: 3 (12/03/17 0032)               Madison HickmanGREGORY,Lajune Perine

## 2017-12-03 NOTE — Lactation Note (Signed)
This note was copied from a baby's chart. Lactation Consultation Note  Patient Name: Rachel Olsen DutyLizbeth Stickle ZOXWR'UToday's Date: 12/03/2017 Reason for consult: Initial assessment;Primapara;1st time breastfeeding;Term  Visited with P1 [name] at 493 hrs old.  Mom holding baby swaddled, and baby starting to fuss.  Offered to assist with latch.   Baby had breastfed after delivery, but Mom unsure of how to position baby now.  Recommended keeping baby STS rather than swaddled, to stimulate more feedings at the breast, goal of 8-12 feedings per 24 hrs.  Assisted with pillow support for football hold on left breast.  Demonstrated breast massage and hand expression, colostrum noted on nipple tip.  Baby latched after a couple attempts deeply onto areola.  Teaching on importance of breast support and alternate breast compression during feeding.  Regular swallows identified for parents.  Mom denies any discomfort with latch.    Basic breastfeeding reviewed. Lactation brochure given to Mom.  Mom aware of IP and OP lactation support available.  Mom to call prn for assistance.   Feeding Feeding Type: Breast Fed Length of feed: (b[facility] still nursing [facility])  LATCH Score Latch: Grasps breast easily, tongue down, lips flanged, rhythmical sucking.  Audible Swallowing: Spontaneous and intermittent  Type of Nipple: Everted at rest and after stimulation  Comfort (Breast/Nipple): Soft / non-tender  Hold (Positioning): Assistance needed to correctly position infant at breast and maintain latch.  LATCH Score: 9  Interventions Interventions: Breast feeding basics reviewed;Assisted with latch;Skin to skin;Breast massage;Hand express;Breast compression;Adjust position;Support pillows;Position options   Consult Status Consult Status: Follow-up Date: 12/04/17 Follow-up type: In-patient    Judee ClaraSmith, Pami Wool E 12/03/2017, 4:26 PM

## 2017-12-03 NOTE — Anesthesia Postprocedure Evaluation (Signed)
Anesthesia Post Note  Patient: Rachel Olsen  Procedure(s) Performed: AN AD HOC LABOR EPIDURAL     Patient location during evaluation: Mother Baby Anesthesia Type: Epidural Level of consciousness: awake and alert Pain management: pain level controlled Vital Signs Assessment: post-procedure vital signs reviewed and stable Respiratory status: spontaneous breathing, nonlabored ventilation and respiratory function stable Cardiovascular status: stable Postop Assessment: no headache, no backache and epidural receding Anesthetic complications: no    Last Vitals:  Vitals:   12/03/17 1401 12/03/17 1445  BP: 105/61 114/74  Pulse: 98 83  Resp:  18  Temp:  37.2 C  SpO2:      Last Pain:  Vitals:   12/03/17 1445  TempSrc: Oral  PainSc:    Pain Goal: Patients Stated Pain Goal: 3 (12/03/17 0032)               Nicolae Vasek

## 2017-12-03 NOTE — Anesthesia Procedure Notes (Signed)
Epidural Patient location during procedure: OB Start time: 12/03/2017 3:48 AM  Staffing Anesthesiologist: Mal AmabileFoster, Jayin Derousse, MD Performed: anesthesiologist   Preanesthetic Checklist Completed: patient identified, site marked, surgical consent, pre-op evaluation, timeout performed, IV checked, risks and benefits discussed and monitors and equipment checked  Epidural Patient position: sitting Prep: site prepped and draped and DuraPrep Patient monitoring: continuous pulse ox and blood pressure Approach: midline Location: L3-L4 Injection technique: LOR air  Needle:  Needle type: Tuohy  Needle gauge: 17 G Needle length: 9 cm and 9 Needle insertion depth: 4 cm Catheter type: closed end flexible Catheter size: 19 Gauge Catheter at skin depth: 9 cm Test dose: negative and Other  Assessment Events: blood not aspirated, injection not painful, no injection resistance, negative IV test and no paresthesia  Additional Notes Patient identified. Risks and benefits discussed including failed block, incomplete  Pain control, post dural puncture headache, nerve damage, paralysis, blood pressure Changes, nausea, vomiting, reactions to medications-both toxic and allergic and post Partum back pain. All questions were answered. Patient expressed understanding and wished to proceed. Sterile technique was used throughout procedure. Epidural site was Dressed with sterile barrier dressing. No paresthesias, signs of intravascular injection Or signs of intrathecal spread were encountered.  Patient was more comfortable after the epidural was dosed. Please see RN's note for documentation of vital signs and FHR which are stable.

## 2017-12-03 NOTE — Progress Notes (Addendum)
ANTIBIOTIC CONSULT NOTE - INITIAL  Pharmacy Consult for Gentamicin Indication: Chorioamnionitis   No Known Allergies  Patient Measurements: Height: 5\' 2"  (157.5 cm) Weight: 145 lb 3.2 oz (65.9 kg) IBW/kg (Calculated) : 50.1 kg Adjusted Body Weight: 54.8 kg  Vital Signs: Temp: 99.1 F (37.3 C) (08/08 0831) Temp Source: Oral (08/08 0831) BP: 114/61 (08/08 0931) Pulse Rate: 82 (08/08 0931)  Labs: Recent Labs    12/02/17 1334  WBC 8.6  CAROLYN HAGGINS NOTIFIED MARY Swazil[place]JORDAN JOHNSON @1645   HGB 12.0  CAROLYN HAGGINS NOTIFIED MARY [place]ilandJORDAN JOHNSON @1645   PLT 223  CAROLYN HAGGINS NOTIFIED MARY [place]azilandJORDAN JOHNSON @1645     Assessment: 22 y.o. female G3P0020 at 4376w3d, in active labor with elevated temperatures Estimated Ke = 0.268, Vd = 21.9  Goal of Therapy:  Gentamicin peak 6-8 mg/L and Trough < 1 mg/L  Plan:  Gentamicin 130 mg IV every 8 hrs  Check Scr with next labs if gentamicin continued. Will check gentamicin levels if continued > 72hr or clinically indicated.  Roopali Sharma 12/03/2017,10:31 AM

## 2017-12-03 NOTE — Progress Notes (Signed)
Patient ID: Rachel Olsen, female   DOB: [birth year], 21 y.o.   MRN: [id number]  CTSP for FHR variables; Pit was stopped (was at 82mu/min)  BP 111/62, P 78 FHR 150s, dec LTV, +10x10accels Ctx q 2 mins  Cx 8+/90/0  IUP@term  Active labor  IUPC inserted without difficulty Will watch MVUs and FHR- may not need Pit Anticipate SVD  Jessalynn Mccowan CNM 12/03/2017 7:05 AM

## 2017-12-04 DIAGNOSIS — Z8759 Personal history of other complications of pregnancy, childbirth and the puerperium: Secondary | ICD-10-CM

## 2017-12-04 HISTORY — DX: Personal history of other complications of pregnancy, childbirth and the puerperium: Z87.59

## 2017-12-04 LAB — CBC
HEMATOCRIT: 25 % — AB (ref 36.0–46.0)
Hemoglobin: 8.7 g/dL — ABNORMAL LOW (ref 12.0–15.0)
MCH: 33.3 pg (ref 26.0–34.0)
MCHC: 34.8 g/dL (ref 30.0–36.0)
MCV: 95.8 fL (ref 78.0–100.0)
PLATELETS: 170 10*3/uL (ref 150–400)
RBC: 2.61 MIL/uL — ABNORMAL LOW (ref 3.87–5.11)
RDW: 19.1 % — AB (ref 11.5–15.5)
WBC: 15.1 10*3/uL — ABNORMAL HIGH (ref 4.0–10.5)

## 2017-12-04 MED ORDER — FERROUS FUMARATE 324 (106 FE) MG PO TABS
1.0000 | ORAL_TABLET | Freq: Two times a day (BID) | ORAL | Status: DC
  Administered 2017-12-05: 106 mg via ORAL
  Filled 2017-12-04 (×3): qty 1

## 2017-12-04 MED ORDER — FERUMOXYTOL INJECTION 510 MG/17 ML
510.0000 mg | Freq: Once | INTRAVENOUS | Status: AC
  Administered 2017-12-04: 510 mg via INTRAVENOUS
  Filled 2017-12-04: qty 17

## 2017-12-04 NOTE — Discharge Instructions (Signed)
Vaginal Delivery, Care After °Refer to this sheet in the next few weeks. These instructions provide you with information about caring for yourself after vaginal delivery. Your health care provider may also give you more specific instructions. Your treatment has been planned according to current medical practices, but problems sometimes occur. Call your health care provider if you have any problems or questions. °What can I expect after the procedure? °After vaginal delivery, it is common to have: °· Some bleeding from your vagina. °· Soreness in your abdomen, your vagina, and the area of skin between your vaginal opening and your anus (perineum). °· Pelvic cramps. °· Fatigue. ° °Follow these instructions at home: °Medicines °· Take over-the-counter and prescription medicines only as told by your health care provider. °· If you were prescribed an antibiotic medicine, take it as told by your health care provider. Do not stop taking the antibiotic until it is finished. °Driving ° °· Do not drive or operate heavy machinery while taking prescription pain medicine. °· Do not drive for 24 hours if you received a sedative. °Lifestyle °· Do not drink alcohol. This is especially important if you are breastfeeding or taking medicine to relieve pain. °· Do not use tobacco products, including cigarettes, chewing tobacco, or e-cigarettes. If you need help quitting, ask your health care provider. °Eating and drinking °· Drink at least 8 eight-ounce glasses of water every day unless you are told not to by your health care provider. If you choose to breastfeed your baby, you may need to drink more water than this. °· Eat high-fiber foods every day. These foods may help prevent or relieve constipation. High-fiber foods include: °? Whole grain cereals and breads. °? Brown rice. °? Beans. °? Fresh fruits and vegetables. °Activity °· Return to your normal activities as told by your health care provider. Ask your health care provider  what activities are safe for you. °· Rest as much as possible. Try to rest or take a nap when your baby is sleeping. °· Do not lift anything that is heavier than your baby or 10 lb (4.5 kg) until your health care provider says that it is safe. °· Talk with your health care provider about when you can engage in sexual activity. This may depend on your: °? Risk of infection. °? Rate of healing. °? Comfort and desire to engage in sexual activity. °Vaginal Care °· If you have an episiotomy or a vaginal tear, check the area every day for signs of infection. Check for: °? More redness, swelling, or pain. °? More fluid or blood. °? Warmth. °? Pus or a bad smell. °· Do not use tampons or douches until your health care provider says this is safe. °· Watch for any blood clots that may pass from your vagina. These may look like clumps of dark red, brown, or black discharge. °General instructions °· Keep your perineum clean and dry as told by your health care provider. °· Wear loose, comfortable clothing. °· Wipe from front to back when you use the toilet. °· Ask your health care provider if you can shower or take a bath. If you had an episiotomy or a perineal tear during labor and delivery, your health care provider may tell you not to take baths for a certain length of time. °· Wear a bra that supports your breasts and fits you well. °· If possible, have someone help you with household activities and help care for your baby for at least a few days after   you leave the hospital. °· Keep all follow-up visits for you and your baby as told by your health care provider. This is important. °Contact a health care provider if: °· You have: °? Vaginal discharge that has a bad smell. °? Difficulty urinating. °? Pain when urinating. °? A sudden increase or decrease in the frequency of your bowel movements. °? More redness, swelling, or pain around your episiotomy or vaginal tear. °? More fluid or blood coming from your episiotomy or  vaginal tear. °? Pus or a bad smell coming from your episiotomy or vaginal tear. °? A fever. °? A rash. °? Little or no interest in activities you used to enjoy. °? Questions about caring for yourself or your baby. °· Your episiotomy or vaginal tear feels warm to the touch. °· Your episiotomy or vaginal tear is separating or does not appear to be healing. °· Your breasts are painful, hard, or turn red. °· You feel unusually sad or worried. °· You feel nauseous or you vomit. °· You pass large blood clots from your vagina. If you pass a blood clot from your vagina, save it to show to your health care provider. Do not flush blood clots down the toilet without having your health care provider look at them. °· You urinate more than usual. °· You are dizzy or light-headed. °· You have not breastfed at all and you have not had a menstrual period for 12 weeks after delivery. °· You have stopped breastfeeding and you have not had a menstrual period for 12 weeks after you stopped breastfeeding. °Get help right away if: °· You have: °? Pain that does not go away or does not get better with medicine. °? Chest pain. °? Difficulty breathing. °? Blurred vision or spots in your vision. °? Thoughts about hurting yourself or your baby. °· You develop pain in your abdomen or in one of your legs. °· You develop a severe headache. °· You faint. °· You bleed from your vagina so much that you fill two sanitary pads in one hour. °This information is not intended to replace advice given to you by your health care provider. Make sure you discuss any questions you have with your health care provider. °Document Released: 04/11/2000 Document Revised: 09/26/2015 Document Reviewed: 04/29/2015 °Elsevier Interactive Patient Education © 2018 [employer] ° °

## 2017-12-04 NOTE — Progress Notes (Addendum)
Post Partum Day 1 for VAVD. Labor and delivery was complicated by PPH (EBL 900cc) due to retained placenta, given methergine. Also had chorioamnionitis, given amp/gent x1 day. Afebrile since delivery but remains tachycardic.  Subjective: no complaints and tolerating PO. States lochia is about the same. Is breastfeeding without issue.   Objective: Blood pressure 113/66, pulse (!) 103, temperature 98.3 F (36.8 C), resp. rate 20, height 5\' 2"  (1.575 m), weight 65.9 kg, last menstrual period 02/09/2017, SpO2 100 %, unknown if currently breastfeeding.  Physical Exam:  General: alert and cooperative Lochia: appropriate Uterine Fundus: firm, below umbilicus DVT Evaluation: No evidence of DVT seen on physical exam.  Recent Labs    12/02/17 1334  HGB 12.0  CAROLYN HAGGINS NOTIFIED MARY [place]JORDAN JOHNSON @1645   HCT 36.0  CAROLYN HAGGINS NOTIFIED MARY [place]JORDAN JOHNSON @1645     Assessment/Plan: - am CBC pending - stop gent/amp at 2pm today if continues to be asymptomatic and afebrile - currently breastfeeding - plan for discharge tomorrow   LOS: 2 days   Candis Schatzatricia Dell 12/04/2017, 7:10 AM   OB FELLOW POSTPARTUM PROGRESS NOTE ATTESTATION  I have seen and examined this patient and agree with above documentation in the resident's note.   Patient with PP hemorrhage. Denies lightheadedness/dizziness, chest pain or SOB. HgB noted to drop from 12.0 to 8.7. Patient has a history of iron deficiency anemia in pregnancy requiring feraheme transfusions. Will give Feraheme x1. Discharge with Fe supplementation.    Marcy Sirenatherine Earlee Herald, D.O. OB Fellow  12/04/2017, 11:21 AM

## 2017-12-05 ENCOUNTER — Ambulatory Visit: Payer: Self-pay

## 2017-12-05 MED ORDER — FERROUS FUMARATE 324 (106 FE) MG PO TABS: 1.0000 | ORAL_TABLET | Freq: Two times a day (BID) | ORAL | 1 refills | Status: AC

## 2017-12-05 MED ORDER — IBUPROFEN 600 MG PO TABS: 600.0000 mg | ORAL_TABLET | Freq: Four times a day (QID) | ORAL | 0 refills | Status: AC | PRN

## 2017-12-05 NOTE — Discharge Summary (Signed)
OB Discharge Summary     Patient Name: Rachel DutyLizbeth Feasel DOB: [birth year] MRN: [id number]  Date of admission: 12/02/2017 Delivering MD: Conan BowensAVIS, KELLY M   Date of discharge: 12/05/2017  Admitting diagnosis: 40WKS, NR NST Intrauterine pregnancy: 6761w3d     Secondary diagnosis:  Active Problems:   Supervision of normal first pregnancy, antepartum   History of herpes genitalis   Anemia in pregnancy   Non-reactive NST (non-stress test)   Postpartum hemorrhage, third stage   Status post vacuum-assisted vaginal delivery  Additional problems: none     Discharge diagnosis: Term Pregnancy Delivered, Anemia and PPH                                                                                                Post partum procedures:IV Feraheme  Augmentation: AROM, Pitocin, Cytotec and Foley Balloon  Complications: Intrauterine Inflammation or infection (Chorioamniotis), retained membranes/PPH of 900cc  Hospital course:  Induction of Labor With Vaginal Delivery   22 y.o. yo [id number] at 9261w3d was admitted to the hospital 12/02/2017 for induction of labor.  Indication for induction: non reactive NST, BPP 6/10.  Patient had a labor course remarkable for becoming complete after IOL methods employed, and then having recurrent variables. Prior to this, she became febrile and was given Amp x 2/Gent x 1. A vacuum was offered to expedite delivery and the pt accepted. She had retained membranes with an EBL of 900cc- see delivery note.  Membrane Rupture Time/Date: 1:38 AM ,12/03/2017   Intrapartum Procedures: Episiotomy: None [1]                                         Lacerations:  Labial [10];Vaginal [6]  Patient had delivery of a Viable infant.  Information for the patient's newborn:  Fuller SongHernandez, Boy Arora [[id number]][[id number]]  Delivery Method: Vaginal, Vacuum (Extractor)(Filed from Delivery Summary)   12/03/2017  Details of delivery can be found in separate delivery note.  Patient had a postpartum course  remarkable for receiving Feraheme on PPD#1 due to Hgb of 8.7. Patient is discharged home 12/05/17 and feels well.  Physical exam  Vitals:   12/04/17 1315 12/04/17 1400 12/04/17 2154 12/05/17 0541  BP: 112/69 103/62 105/61 (!) 95/55  Pulse: (!) 102 88 91 83  Resp: 18 18 18    Temp: 98.4 F (36.9 C) 98.2 F (36.8 C) 98.1 F (36.7 C) 98.7 F (37.1 C)  TempSrc:   Oral Oral  SpO2: 98% 98% 100%   Weight:      Height:       General: alert and cooperative Lochia: appropriate Uterine Fundus: firm Incision: N/A DVT Evaluation: No evidence of DVT seen on physical exam. Labs: Lab Results  Component Value Date   WBC 15.1 (H) 12/04/2017   HGB 8.7 (L) 12/04/2017   HCT 25.0 (L) 12/04/2017   MCV 95.8 12/04/2017   PLT 170 12/04/2017   No flowsheet data found.  Discharge instruction: per After Visit Summary and "Baby and Me Booklet".  After visit meds:  Allergies as of 12/05/2017   No Known Allergies     Medication List    STOP taking these medications   valACYclovir 1000 MG tablet Commonly known as:  VALTREX     TAKE these medications   CITRANATAL BLOOM 90-1 MG Tabs Take 1 tablet by mouth daily.   Ferrous Fumarate 324 (106 Fe) MG Tabs tablet Commonly known as:  HEMOCYTE - 106 mg FE Take 1 tablet (106 mg of iron total) by mouth 2 (two) times daily.   ibuprofen 600 MG tablet Commonly known as:  ADVIL,MOTRIN Take 1 tablet (600 mg total) by mouth every 6 (six) hours as needed.   imiquimod 5 % cream Commonly known as:  ALDARA Apply topically 3 (three) times a week.       Diet: routine diet  Activity: Advance as tolerated. Pelvic rest for 6 weeks.   Outpatient follow up:4 weeks Follow up Appt: Future Appointments  Date Time Provider Department Center  01/01/2018  8:30 AM Brock Bad, MD [facility] None   Follow up Visit:No follow-ups on file.  Postpartum contraception: None  Newborn Data: Live born female  Birth Weight: 7 lb 5.8 oz (3340 g) APGAR: 3,  9  Newborn Delivery   Birth date/time:  12/03/2017 12:38:00 Delivery type:  Vaginal, Vacuum (Extractor)     Baby Feeding: Breast Disposition:home with mother- pending Peds (baby currently under bililights)   12/05/2017 Aara Jacquot, CNM  9:19 AM

## 2017-12-05 NOTE — Lactation Note (Addendum)
This note was copied from a baby's chart. Lactation Consultation Note  Patient Name: Rachel Olsen DutyLizbeth Ashurst [id number]'s Date: 12/05/2017 Reason for consult: Follow-up assessment;Primapara;1st time breastfeeding;Term;Infant weight loss;Hyperbilirubinemia  56 hours old FT female at 4% weight loss who is now being partially BF and formula fed by his mother, she's a P1. Baby started double phototherapy today. Mom is supplementing with Enfamil 20 calorie formula already but she's not pumping. Discussed pumping with mom, she voiced she doesn't have a DEBP at home but that she's planning on getting one and she's willing to start pumping while at the hospital.  Set up a DEBP, reviewed pump instructions, cleaning and storage. Mom will be pumping after feedings, at least 6 times/24 hours including once at night. Parents understand that pumping and feeding baby any amount of EBM is just an add on to current feeding plan, baby still needs to get supplemented at this point. Mom is hopeful to get volume during pumping but LC let mom know that she may not get volume quit yet, but that the purpose of pumping is to get the stimulation at the breast. Mom voiced understanding. Mom started pumping right away, she seemed very committed adding breast massage, breast knitting and finger tapping prior pumping.   Will review feeding plan again prior discharge, to see if mom can start replacing some volume of formula for breastmilk (once she starts getting volume). Baby was swaddled and in blue blanket during LC consultation, unable to observe a feeding, but per mom BF is going well. Asked parents to call for latch assistance when needed. They're both aware of LC services and will call PRN.  Maternal Data    Feeding Feeding Type: Bottle Fed - Formula  Interventions Interventions: Breast feeding basics reviewed;DEBP;Breast massage;Breast compression;Hand express  Lactation Tools Discussed/Used Tools: Pump Breast pump type:  Double-Electric Breast Pump Pump Review: Setup, frequency, and cleaning Initiated by:: MPeck Date initiated:: 12/05/17   Consult Status Consult Status: Follow-up Date: 12/06/17 Follow-up type: In-patient    Quincey Quesinberry Venetia ConstableS Dominiq Fontaine 12/05/2017, 8:58 PM

## 2017-12-06 ENCOUNTER — Ambulatory Visit: Payer: Self-pay

## 2017-12-06 NOTE — Lactation Note (Signed)
This note was copied from a baby's chart. Lactation Consultation Note  Patient Name: Rachel Olsen Today's Date: 12/06/2017 Reason for consult: Follow-up assessment;Primapara;1st time breastfeeding;Term;Infant weight loss;Hyperbilirubinemia  Baby is 71 hours old  Still on Double Photo. As LC entered the room , baby on lights and dad feeding baby a bottle. Mom sitting in the chair.  Per mom last breast fed at 935 am until 1013, hearing more swallows. Feeding more at the breast and pumping less.  LC reviewed supply and demand and the importance of stimulation to enhance milk let down.  Per mom is not active with WIC and will not have a DEBP at home.  LC instructed mom on the use hand pump - recommended prior to every feeding breast massage, hand express, pre-pump  To prime the milk ducts and latch the baby STS, feed 1st breast soften well , and offer the 2nd breast. If baby doesn't feed onb 2nd breast, release to comfort and save milk to feed back to baby.  Mom denies soreness, sore nipple and engorgement prevention and tx reviewed.  LC recommended taking the DEBP kit home in case she has to come back and rent a DEBP.  LC discussed nutritive vs non - nutritive feeding patterns and the importance of watching the baby for hanging out latched.   According to the Pedis MD note - photo to be D/C and Bili recheck this afternoon.   Mother informed of post-discharge support and given phone number to the lactation department, including services for phone call assistance; out-patient appointments; and breastfeeding support group. List of other breastfeeding resources in the community given in the handout. Encouraged mother to call for problems or concerns related to breastfeeding.  LC let mom know if the baby ends up staying and she needs assistance to call on the nurses light.    Maternal Data Has patient been taught Hand Expression?: Yes(per mom has been shown and feels comfortable  )  Feeding Feeding Type: (last BF at 935 am )  LATCH Score                   Interventions Interventions: Breast feeding basics reviewed  Lactation Tools Discussed/Used Tools: Pump Breast pump type: Manual;Double-Electric Breast Pump WIC Program: No Pump Review: Setup, frequency, and cleaning;Milk Storage Initiated by:: MAI  Date initiated:: 12/06/17   Consult Status Consult Status: Follow-up Date: 12/07/17 Follow-up type: In-patient    [name] 12/06/2017, 1:31 PM    

## 2017-12-08 ENCOUNTER — Inpatient Hospital Stay (HOSPITAL_COMMUNITY): Admission: RE | Admit: 2017-12-08 | Payer: Medicaid Other | Source: Ambulatory Visit

## 2018-01-01 ENCOUNTER — Ambulatory Visit: Payer: Medicaid Other | Admitting: Obstetrics

## 2018-01-13 ENCOUNTER — Encounter: Payer: Self-pay | Admitting: Obstetrics

## 2018-01-13 ENCOUNTER — Ambulatory Visit (INDEPENDENT_AMBULATORY_CARE_PROVIDER_SITE_OTHER): Payer: Medicaid Other | Admitting: Obstetrics & Gynecology

## 2018-01-13 DIAGNOSIS — O872 Hemorrhoids in the puerperium: Secondary | ICD-10-CM

## 2018-01-13 DIAGNOSIS — Z1389 Encounter for screening for other disorder: Secondary | ICD-10-CM | POA: Diagnosis not present

## 2018-01-13 MED ORDER — HYDROCORTISONE 2.5 % RE CREA: 1.0000 "application " | TOPICAL_CREAM | Freq: Two times a day (BID) | RECTAL | 2 refills | Status: AC

## 2018-01-13 NOTE — Progress Notes (Signed)
Post Partum Exam  Rachel DutyLizbeth Zawadzki is a 22 y.o. 373P1021 female who presents for a postpartum visit. She is 5 weeks postpartum following a vacuum assisted vaginal delivery. I have fully reviewed the prenatal and intrapartum course. The delivery was at 40 gestational weeks.  Anesthesia: epidural. 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 Discharge hemoglobin was 8.7, patient is on oral iron therapy.  Postpartum course has been uncomplicated. Baby's course has been uncomplicated. Baby is feeding by breast. Bleeding no bleeding. Bowel function is normal. Bladder function is normal. Patient is sexually active. Contraception method is none. Postpartum depression screening:negative. Desires medication to help with hemorrhoids.  The following portions of the patient's history were reviewed and updated as appropriate: allergies, current medications, past family history, past medical history, past social history, past surgical history and problem list.   Review of Systems Pertinent items noted in HPI and remainder of comprehensive ROS otherwise negative.    Objective:  Blood pressure 104/66, pulse 64, height 5\' 2"  (1.575 m), weight 112 lb 12.8 oz (51.2 kg), last menstrual period 02/09/2017, currently breastfeeding.  General:  alert and no distress   Breasts:  inspection negative, no nipple discharge or bleeding, no masses or nodularity palpable  Lungs: clear to auscultation bilaterally  Heart:  regular rate and rhythm  Abdomen: soft, non-tender; bowel sounds normal; no masses,  no organomegaly  Pelvic:  not evaluated        Assessment:   Normal postpartum exam.    Plan:   1. Contraception: Reviewed all forms of birth control options available including abstinence; fertility period awareness methods; over the counter/barrier methods; hormonal contraceptive medication including pill, patch, ring, injection,contraceptive implant; hormonal and nonhormonal IUDs; permanent sterilization  options including vasectomy and the various tubal sterilization modalities. Risks and benefits reviewed.  Questions were answered.  Information was given to patient to review.  She desires fertility awareness method for now.  2. Hemorrhoid: Anusol prescribed as needed  3. Follow up as needed.    Jaynie CollinsUGONNA  Veronique Warga, MD, FACOG Obstetrician & Gynecologist, [facility], [facility]

## 2018-01-13 NOTE — Patient Instructions (Signed)
Return to clinic for any scheduled appointments or for any gynecologic concerns as needed.   

## 2018-09-25 IMAGING — US US MFM OB COMP +14 WKS
1 series · 14 of 28 positions shown · non-contrast
Comparison: none

[Series 1: us mfm ob comp +14 wks · 14 of 85 slices shown]
[im 4/85]
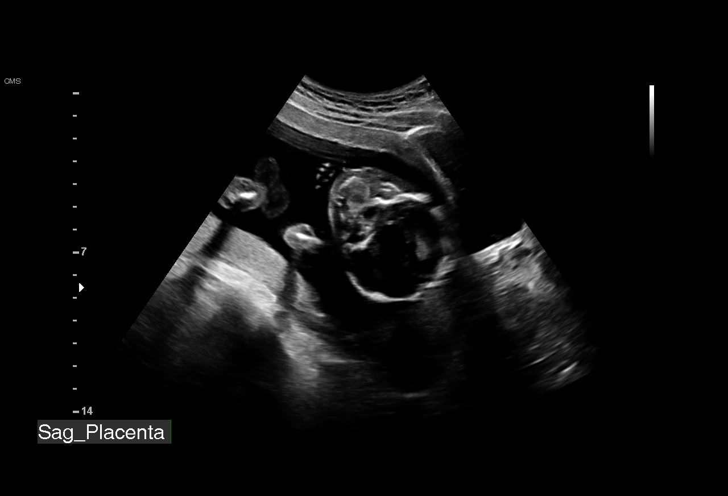
[im 10/85]
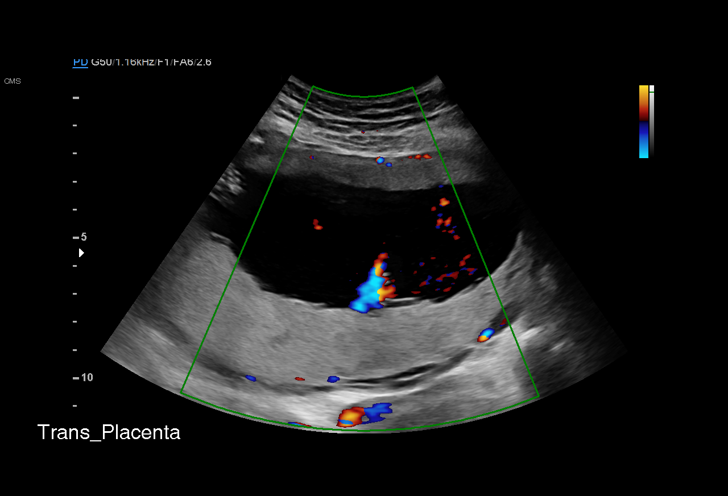
[im 16/85]
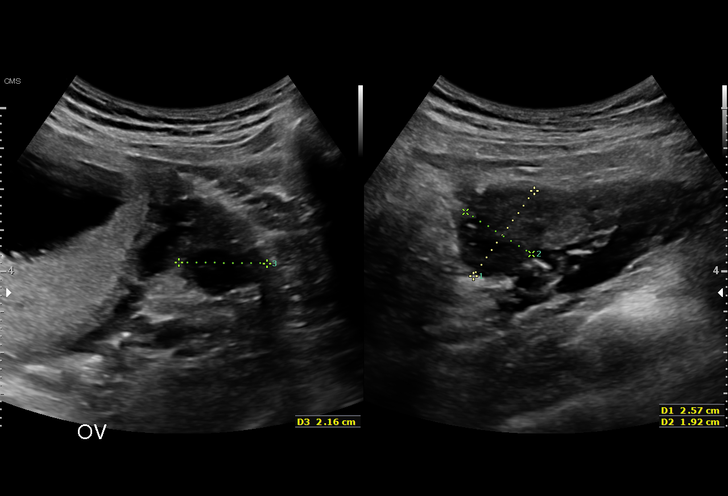
[im 22/85]
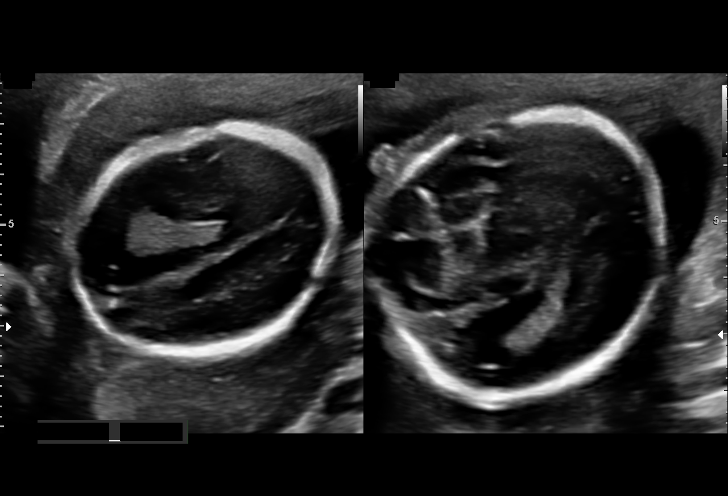
[im 29/85]
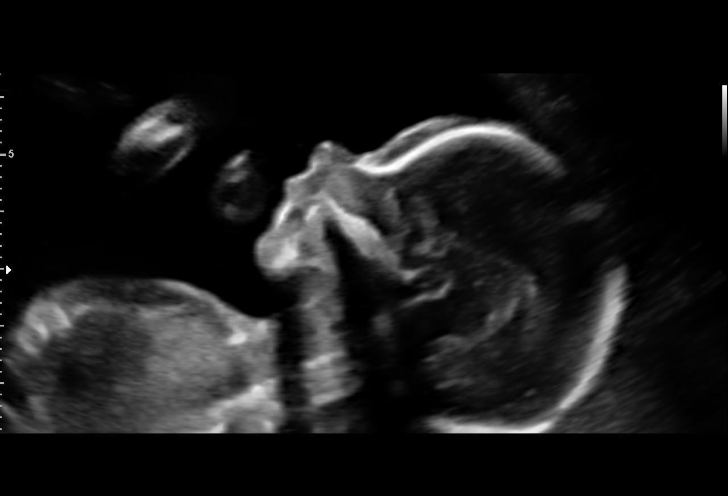
[im 35/85]
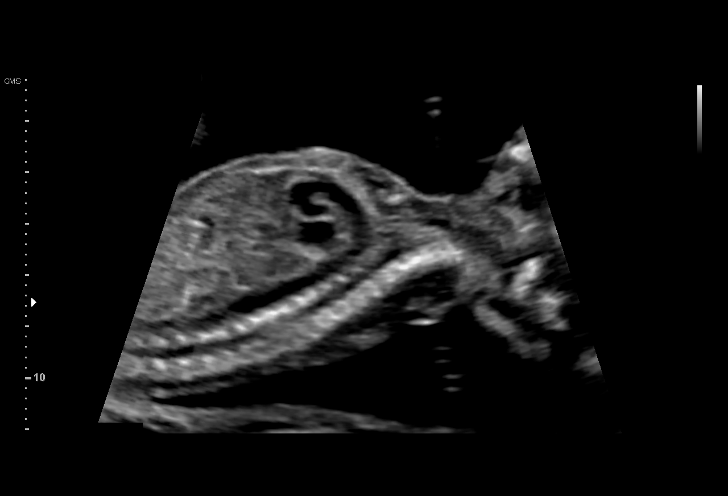
[im 41/85]
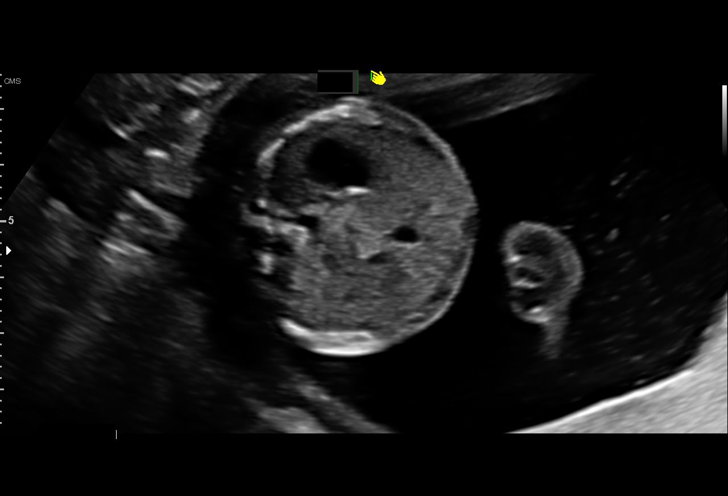
[im 47/85]
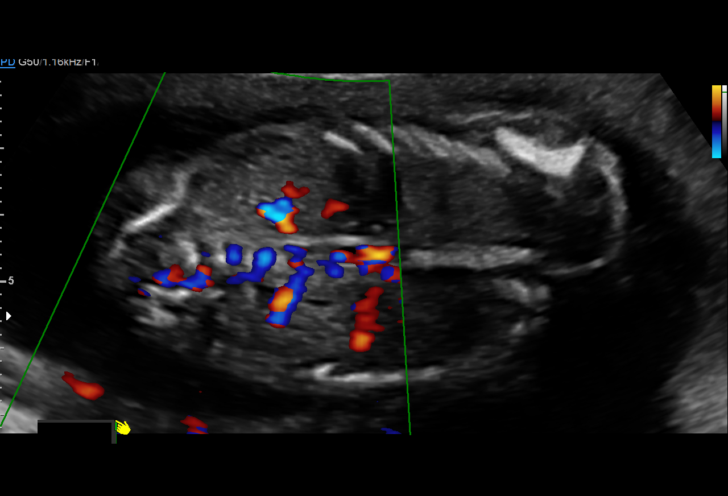
[im 53/85]
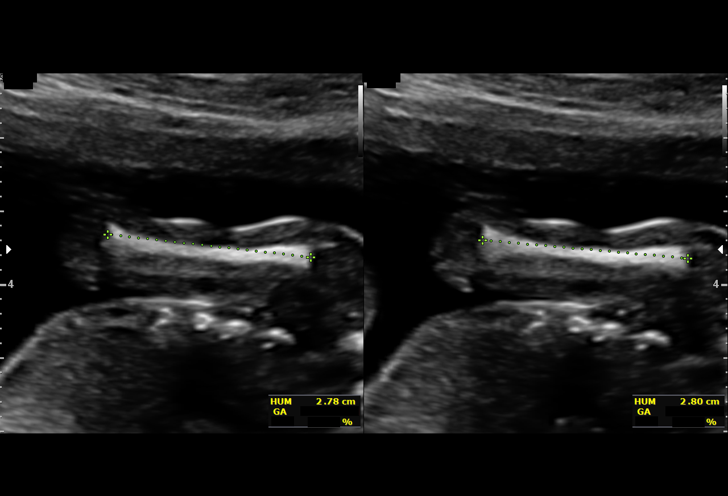
[im 60/85]
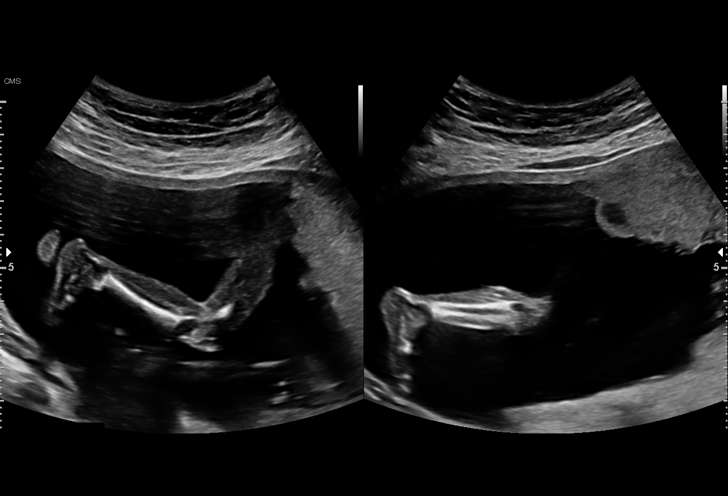
[im 66/85]
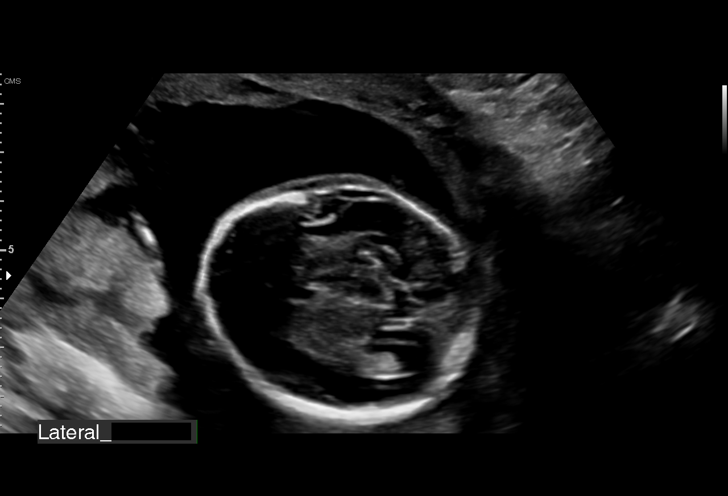
[im 72/85]
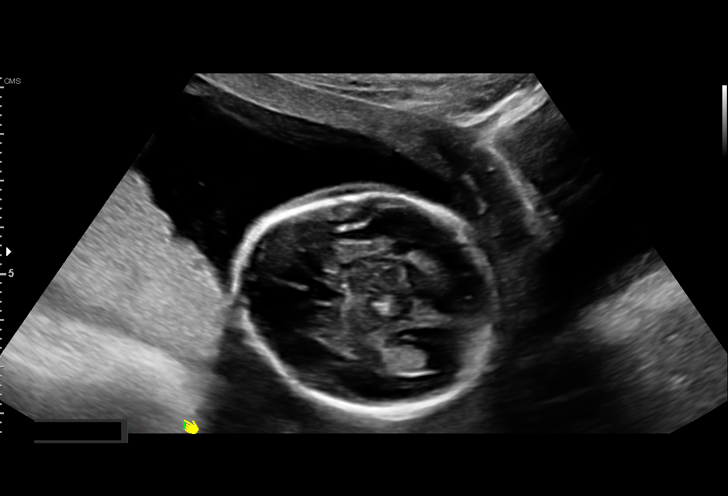
[im 78/85]
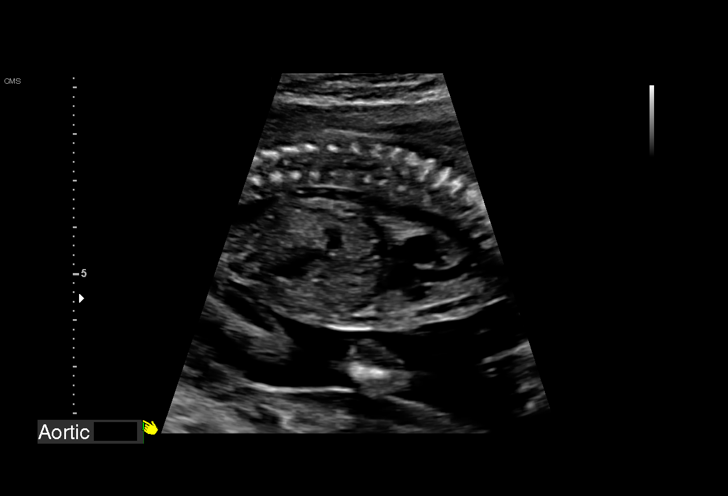
[im 85/85]
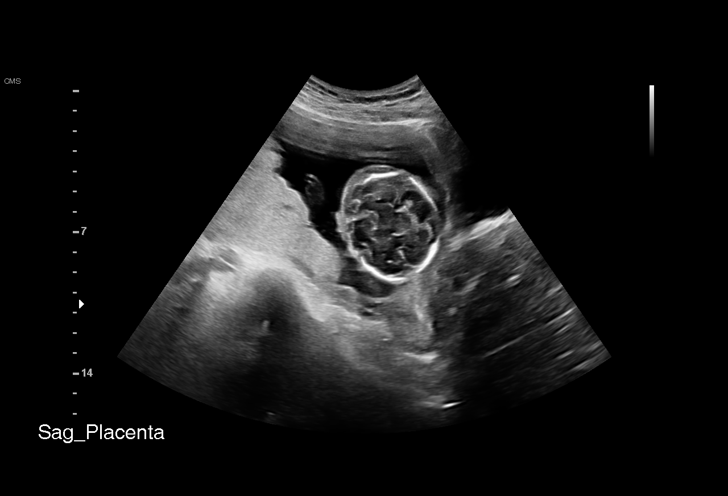

[14 of 28 positions shown; findings below may reference images not displayed]

[REDACTED]care - [HOSPITAL]

1  DIOS BLUMBERG           [id number]      [id number]     [id number]
Indications

19 weeks gestation of pregnancy
Encounter for fetal anatomic survey
OB History

Gravidity:    3         Term:   0        Prem:   0        SAB:   1
TOP:          1       Ectopic:  0        Living: 0
Fetal Evaluation

Num Of Fetuses:     1
Fetal Heart         170
Rate(bpm):
Cardiac Activity:   Observed
Presentation:       Cephalic
Placenta:           Posterior, above cervical os
P. Cord Insertion:  Visualized

Amniotic Fluid
AFI FV:      Subjectively within normal limits

Largest Pocket(cm)
6.95
Biometry

BPD:      46.1  mm     G. Age:  20w 0d         82  %    CI:        74.52   %    70 - 86
FL/HC:      17.3   %    16.1 -
HC:      169.5  mm     G. Age:  19w 4d         63  %    HC/AC:      1.17        1.09 -
AC:      145.1  mm     G. Age:  19w 6d         68  %    FL/BPD:     63.6   %
FL:       29.3  mm     G. Age:  19w 0d         39  %    FL/AC:      20.2   %    20 - 24
HUM:      27.9  mm     G. Age:  19w 0d         46  %
CER:      20.2  mm     G. Age:  19w 1d         51  %
NFT:       3.3  mm

CM:        5.1  mm

Est. FW:     295  gm    0 lb 10 oz      51  %
Gestational Age

LMP:           21w 1d        Date:  02/09/17                 EDD:   11/16/17
U/S Today:     19w 4d                                        EDD:   11/27/17
Best:          19w 1d     Det. By:  Previous Ultrasound      EDD:   11/30/17
(04/07/17)
Anatomy

Cranium:               Appears normal         Aortic Arch:            Appears normal
Cavum:                 Appears normal         Ductal Arch:            Appears normal
Ventricles:            Appears normal         Diaphragm:              Appears normal
Choroid Plexus:        Appears normal         Stomach:                Appears normal, left
sided
Cerebellum:            Appears normal         Abdomen:                Appears normal
Posterior Fossa:       Appears normal         Abdominal Wall:         Appears nml (cord
insert, abd wall)
Nuchal Fold:           Appears normal         Cord Vessels:           Appears normal (3
vessel cord)
Face:                  Appears normal         Kidneys:                Appear normal
(orbits and profile)
Lips:                  Appears normal         Bladder:                Appears normal
Thoracic:              Appears normal         Spine:                  Appears normal
Heart:                 Appears normal         Upper Extremities:      Appears normal
(4CH, axis, and
situs)
RVOT:                  Appears normal         Lower Extremities:      Appears normal
LVOT:                  Appears normal

Other:  Fetus appears to be a male. Heels and 5th digit visualized. Nasal
bone visualized.
Cervix Uterus Adnexa

Cervix
Length:            3.4  cm.
Normal appearance by transabdominal scan.

Uterus
No abnormality visualized.

Left Ovary
Within normal limits.

Right Ovary
Not visualized.

Cul De Sac:   No free fluid seen.

Adnexa:       No abnormality visualized.
Impression

SIUP at 19+1 weeks with cardiac activity
Normal detailed fetal anatomy
Markers of aneuploidy: none
Normal amniotic fluid volume
Measurements consistent with prior US
Recommendations

Follow-up as clinically indicated
